# Patient Record
Sex: Female | Born: 1990 | Race: White | Hispanic: No | Marital: Single | Smoking: Former smoker
Health system: Southern US, Community
[De-identification: ages and names within clinical notes are randomized; demographics above are authoritative.]

## PROBLEM LIST (undated history)

## (undated) DIAGNOSIS — A498 Other bacterial infections of unspecified site: Secondary | ICD-10-CM

## (undated) DIAGNOSIS — Z9889 Other specified postprocedural states: Secondary | ICD-10-CM

## (undated) DIAGNOSIS — F419 Anxiety disorder, unspecified: Secondary | ICD-10-CM

## (undated) DIAGNOSIS — K219 Gastro-esophageal reflux disease without esophagitis: Secondary | ICD-10-CM

## (undated) DIAGNOSIS — L0100 Impetigo, unspecified: Secondary | ICD-10-CM

## (undated) DIAGNOSIS — F32A Depression, unspecified: Secondary | ICD-10-CM

## (undated) DIAGNOSIS — E282 Polycystic ovarian syndrome: Secondary | ICD-10-CM

## (undated) DIAGNOSIS — F1911 Other psychoactive substance abuse, in remission: Secondary | ICD-10-CM

## (undated) DIAGNOSIS — T8859XA Other complications of anesthesia, initial encounter: Secondary | ICD-10-CM

## (undated) DIAGNOSIS — T7840XA Allergy, unspecified, initial encounter: Secondary | ICD-10-CM

## (undated) DIAGNOSIS — F329 Major depressive disorder, single episode, unspecified: Secondary | ICD-10-CM

## (undated) DIAGNOSIS — K5909 Other constipation: Secondary | ICD-10-CM

## (undated) HISTORY — PX: PILONIDAL CYST DRAINAGE: SHX743

## (undated) HISTORY — DX: Allergy, unspecified, initial encounter: T78.40XA

## (undated) HISTORY — DX: Anxiety disorder, unspecified: F41.9

## (undated) HISTORY — PX: MICRODISCECTOMY LUMBAR: SUR864

---
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
## 2011-06-17 ENCOUNTER — Encounter: Payer: Self-pay | Admitting: *Deleted

## 2011-06-17 ENCOUNTER — Emergency Department (HOSPITAL_BASED_OUTPATIENT_CLINIC_OR_DEPARTMENT_OTHER)
Admission: EM | Admit: 2011-06-17 | Discharge: 2011-06-17 | Disposition: A | Discharge status: Home / Self Care | Payer: BC Managed Care – PPO | Attending: Emergency Medicine | Admitting: Emergency Medicine

## 2011-06-17 DIAGNOSIS — L0100 Impetigo, unspecified: Secondary | ICD-10-CM | POA: Insufficient documentation

## 2011-06-17 HISTORY — DX: Impetigo, unspecified: L01.00

## 2011-06-17 HISTORY — DX: Polycystic ovarian syndrome: E28.2

## 2011-06-17 MED ORDER — CLINDAMYCIN HCL 300 MG PO CAPS: 300.0000 mg | ORAL_CAPSULE | Freq: Four times a day (QID) | ORAL | Status: AC

## 2011-06-17 NOTE — ED Notes (Signed)
Pt states she was diagnosed with impetigo 2 weeks ago. Has been on PO antibiotic for 10 days. No better. Called her Dr. And was told to come to ED for ? IV antibiotics. Occurring only on face.

## 2011-06-17 NOTE — ED Notes (Signed)
D/c home with new RX for clindamycin

## 2011-06-17 NOTE — ED Provider Notes (Signed)
Scribed for Dr. Fonnie Jarvis, the patient was seen in room 06. This chart was scribed by Hillery Hunter. This patient's care was started at 15:51.   History   CSN: [id number] Arrival date & time: 06/17/2011  3:41 PM  Chief Complaint  Patient presents with  . Impetigo   Patient is a 20 y.o. female presenting with impetigo. The history is provided by the patient.  Impetigo Pertinent negatives include no chest pain, no abdominal pain, no headaches and no shortness of breath.    Cassie Blake is a 20 y.o. female who presents to the Emergency Department complaining of improving facial rash for the last few weeks. The rash at its worst was red, swollen with pustules and drainage, but has improved and is now without pustules and pain reduced to 2/10 now. She has been seen for these symptoms and prescribed Bactrim (10 days) that she has taken as prescribed. She had a fever (max 102 measured at home) that resolved one week ago.  She denies hx of DM, HTN, and takes metformin for PCOS without chronic abdominal pain menometrorrhagia. She is also taking Vivance for ADHD, and just finished two weeks of an unknown antibiotic. She has chronic migraines.  She smokes, drinks, but denies IVDA, used to use multiple street drugs, but recently got out of rehab and only smokes marijuana now.   Past Medical History  Diagnosis Date  . Impetigo   . Polycystic ovary syndrome     History reviewed. No pertinent past surgical history.  History reviewed. No pertinent family history.  History  Substance Use Topics  . Smoking status: Current Everyday Smoker  . Smokeless tobacco: Not on file  . Alcohol Use: Yes    Review of Systems  Constitutional: Negative for fever.  HENT: Negative for congestion and sore throat.   Eyes: Positive for discharge. Negative for visual disturbance.  Respiratory: Negative for shortness of breath.   Cardiovascular: Negative for chest pain.  Gastrointestinal: Negative for  nausea, vomiting, abdominal pain and diarrhea.  Genitourinary: Negative for dysuria.  Musculoskeletal: Negative for back pain.  Skin: Positive for rash.  Neurological: Negative for headaches.  Psychiatric/Behavioral: Negative for confusion and dysphoric mood.  All other systems reviewed and are negative.    Physical Exam  BP 119/70  Pulse 98  Temp(Src) 99.3 F (37.4 C) (Oral)  Resp 20  Ht 5\' 6"  (1.676 m)  Wt 250 lb (113.399 kg)  BMI 40.35 kg/m2  SpO2 98%  LMP 06/16/2008  Physical Exam  Nursing note and vitals reviewed. Constitutional:       Awake, alert, nontoxic appearance.  HENT:  Head: Atraumatic.  Eyes: Conjunctivae are normal. Pupils are equal, round, and reactive to light. Right eye exhibits no discharge. Left eye exhibits no discharge.  Neck: Neck supple.  Cardiovascular: Normal rate and normal heart sounds.   Pulmonary/Chest: Effort normal and breath sounds normal. She exhibits no tenderness.  Abdominal: Soft. There is no tenderness. There is no rebound.  Musculoskeletal: She exhibits no tenderness.       Baseline ROM, no obvious new focal weakness.  Neurological:       Mental status and motor strength appears baseline for patient and situation.  Skin: Rash (full face with multiple areas of confluent, erythematous papules, with some scaling and dry honey-colored crust, the only wheeping now is clear serous fluid, no fluctuance, warmth, or tenderness noted) noted.  Psychiatric: She has a normal mood and affect. Her behavior is normal.    ED Course  Procedures  OTHER DATA REVIEWED: Nursing notes, vital signs, and past medical records reviewed.   DIAGNOSTIC STUDIES: Oxygen Saturation is 98% on room air, normal by my interpretation.    MDM:06/17/2011 I doubt any other EMC precluding discharge at this time including, but not necessarily limited to the following:sepsis, abscess, TEN, SJS, SSSS.   IMPRESSION: Diagnoses that have been ruled out:  Diagnoses that  are still under consideration:  Final diagnoses:  Impetigo    PLAN:  Discharge home with clindamycin I have reviewed the discharge instructions with the patient  CONDITION ON DISCHARGE: Stable  DISCHARGE MEDICATIONS: New Prescriptions   CLINDAMYCIN (CLEOCIN) 300 MG CAPSULE    Take 1 capsule (300 mg total) by mouth 4 (four) times daily. X 7 days    SCRIBE ATTESTATION: I personally performed the services described in this documentation, which was scribed in my presence. The recorded information has been reviewed and considered. No att. providers found    Hurman Horn, MD 06/18/11 1316

## 2011-07-09 LAB — PROLACTIN: Prolactin: 8.2

## 2011-07-09 LAB — DRUGS OF ABUSE SCREEN W/O ALC, ROUTINE URINE
Barbiturate Quant, Ur: NEGATIVE
Benzodiazepines.: NEGATIVE
Creatinine,U: 354.5
Marijuana Metabolite: NEGATIVE
Methadone: NEGATIVE

## 2011-07-09 LAB — PREGNANCY, URINE: Preg Test, Ur: NEGATIVE

## 2011-07-09 LAB — RPR: RPR Ser Ql: NONREACTIVE

## 2011-07-09 LAB — TSH: TSH: 1.247

## 2011-07-09 LAB — CARBAMAZEPINE LEVEL, TOTAL: Carbamazepine Lvl: 6.6

## 2011-10-18 ENCOUNTER — Emergency Department (HOSPITAL_BASED_OUTPATIENT_CLINIC_OR_DEPARTMENT_OTHER)
Admission: EM | Admit: 2011-10-18 | Discharge: 2011-10-18 | Disposition: A | Discharge status: Home / Self Care | Payer: BC Managed Care – PPO | Attending: Emergency Medicine | Admitting: Emergency Medicine

## 2011-10-18 ENCOUNTER — Encounter (HOSPITAL_BASED_OUTPATIENT_CLINIC_OR_DEPARTMENT_OTHER): Payer: Self-pay | Admitting: *Deleted

## 2011-10-18 ENCOUNTER — Emergency Department (INDEPENDENT_AMBULATORY_CARE_PROVIDER_SITE_OTHER): Payer: BC Managed Care – PPO

## 2011-10-18 DIAGNOSIS — J3489 Other specified disorders of nose and nasal sinuses: Secondary | ICD-10-CM | POA: Insufficient documentation

## 2011-10-18 DIAGNOSIS — R05 Cough: Secondary | ICD-10-CM

## 2011-10-18 DIAGNOSIS — R0989 Other specified symptoms and signs involving the circulatory and respiratory systems: Secondary | ICD-10-CM

## 2011-10-18 DIAGNOSIS — Z79899 Other long term (current) drug therapy: Secondary | ICD-10-CM | POA: Insufficient documentation

## 2011-10-18 DIAGNOSIS — J45909 Unspecified asthma, uncomplicated: Secondary | ICD-10-CM

## 2011-10-18 DIAGNOSIS — R509 Fever, unspecified: Secondary | ICD-10-CM

## 2011-10-18 MED ORDER — ALBUTEROL SULFATE (5 MG/ML) 0.5% IN NEBU: 2.5000 mg | INHALATION_SOLUTION | RESPIRATORY_TRACT | Status: DC

## 2011-10-18 MED ORDER — ACETAMINOPHEN-CODEINE #3 300-30 MG PO TABS: 1.0000 | ORAL_TABLET | Freq: Four times a day (QID) | ORAL | Status: AC | PRN

## 2011-10-18 MED ORDER — ALBUTEROL SULFATE HFA 108 (90 BASE) MCG/ACT IN AERS: 1.0000 | INHALATION_SPRAY | Freq: Four times a day (QID) | RESPIRATORY_TRACT | Status: DC | PRN

## 2011-10-18 MED ORDER — IPRATROPIUM BROMIDE 0.02 % IN SOLN: 0.5000 mg | RESPIRATORY_TRACT | Status: DC

## 2011-10-18 MED ORDER — ALBUTEROL SULFATE (5 MG/ML) 0.5% IN NEBU
2.5000 mg | INHALATION_SOLUTION | Freq: Once | RESPIRATORY_TRACT | Status: AC
  Administered 2011-10-18: 2.5 mg via RESPIRATORY_TRACT
  Filled 2011-10-18: qty 0.5

## 2011-10-18 MED ORDER — AZITHROMYCIN 250 MG PO TABS: 250.0000 mg | ORAL_TABLET | Freq: Every day | ORAL | Status: AC

## 2011-10-18 MED ORDER — IPRATROPIUM BROMIDE 0.02 % IN SOLN
0.5000 mg | Freq: Once | RESPIRATORY_TRACT | Status: AC
  Administered 2011-10-18: 0.5 mg via RESPIRATORY_TRACT
  Filled 2011-10-18: qty 2.5

## 2011-10-18 NOTE — ED Provider Notes (Signed)
History     CSN: [id number]  Arrival date & time 10/18/11  1711   First MD Initiated Contact with Patient 10/18/11 1754      Chief Complaint  Patient presents with  . Cough  . Nasal Congestion    (Consider location/radiation/quality/duration/timing/severity/associated sxs/prior treatment) HPI  20yoF h/o asthma presents with shortness of breath, cough, congestion. Patient complains of 2 weeks of the same. Her cough is getting worse. She complains of diffuse chest soreness with coughing only. She states she felt short of breath today and took her albuterol inhaler with minimal relief. She complains of subjective fevers, chills. She denies abdominal pain, nausea, vomiting, back pain. Denies h/o VTE in self or family. No recent hosp/surg/immob. No h/o cancer. Denies exogenous hormone use, no leg pain or swelling  ED Notes, ED Provider Notes from 10/18/11 0000 to 10/18/11 17:24:01       Amy Theotis Barrio, RN 10/18/2011 17:22      PT AMB TO TRIAGE WITH QUICK STEADY GAIT IN NAD. PT REPORTS 2 WEEKS OF COUGH AND CONGESTION, SEEN AT [facility] "THEY GAVE ME A SHOT OF SOME KIND...".     Past Medical History  Diagnosis Date  . Impetigo   . Polycystic ovary syndrome   . Asthma     History reviewed. No pertinent past surgical history.  History reviewed. No pertinent family history.  History  Substance Use Topics  . Smoking status: Current Everyday Smoker  . Smokeless tobacco: Not on file  . Alcohol Use: Yes    OB History    Grav Para Term Preterm Abortions TAB SAB Ect Mult Living                  Review of Systems  All other systems reviewed and are negative.   except as noted HPI   Allergies  Review of patient's allergies indicates no known allergies.  Home Medications   Current Outpatient Rx  Name Route Sig Dispense Refill  . ALBUTEROL 90 MCG/ACT IN AERS Inhalation Inhale 2 puffs into the lungs every 6 (six) hours as needed. Shortness of breath and wheezing     .  VITAMIN C PO Oral Take 1 tablet by mouth daily.      Marland Kitchen LISDEXAMFETAMINE DIMESYLATE 70 MG PO CAPS Oral Take 70 mg by mouth every morning.      Marland Kitchen METFORMIN HCL ER (MOD) 500 MG PO TB24 Oral Take 500 mg by mouth 2 (two) times daily.     Marland Kitchen PHENYLEPHRINE-DM-GG-APAP 5-10-200-325 MG PO TABS Oral Take 2 tablets by mouth every 4 (four) hours as needed. For cough and cold symptoms     . VITAMIN D (ERGOCALCIFEROL) 50000 UNITS PO CAPS Oral Take 50,000 Units by mouth every 7 (seven) days. On Mondays     . ACETAMINOPHEN-CODEINE #3 300-30 MG PO TABS Oral Take 1-2 tablets by mouth every 6 (six) hours as needed for pain. 15 tablet 0  . ALBUTEROL SULFATE HFA 108 (90 BASE) MCG/ACT IN AERS Inhalation Inhale 1-2 puffs into the lungs every 6 (six) hours as needed for wheezing. 1 Inhaler 0  . AZITHROMYCIN 250 MG PO TABS Oral Take 1 tablet (250 mg total) by mouth daily. Take first 2 tablets together, then 1 every day until finished. 6 tablet 0    BP 129/63  Pulse 113  Temp(Src) 98.5 F (36.9 C) (Oral)  Resp 20  Ht 5\' 6"  (1.676 m)  Wt 260 lb (117.935 kg)  BMI 41.97 kg/m2  SpO2 96%  Physical Exam  Nursing note and vitals reviewed. Constitutional: She is oriented to person, place, and time. She appears well-developed.  HENT:  Head: Atraumatic.       +nasal congestion Mild posterior OP erythema ,no tonsillar swelling or exudates  Eyes: Conjunctivae and EOM are normal. Pupils are equal, round, and reactive to light.  Neck: Normal range of motion. Neck supple.  Cardiovascular: Normal rate, regular rhythm, normal heart sounds and intact distal pulses.   Pulmonary/Chest: Effort normal. No respiratory distress. She has wheezes. She has no rales.       Exp wheeze lt base  Abdominal: Soft. She exhibits no distension. There is no tenderness. There is no rebound and no guarding.  Musculoskeletal: Normal range of motion.  Neurological: She is alert and oriented to person, place, and time.  Skin: Skin is warm and dry. No  rash noted.  Psychiatric: She has a normal mood and affect.    ED Course  Procedures (including critical care time)  Labs Reviewed - No data to display Dg Chest 2 View  10/18/2011  *RADIOLOGY REPORT*  Clinical Data: Cough, congestion, fever.  CHEST - 2 VIEW  Comparison: 03/05/2008  Findings: Mild peribronchial thickening. Heart and mediastinal contours are within normal limits.  No focal opacities or effusions.  No acute bony abnormality.  IMPRESSION: Bronchitic changes.  Original Report Authenticated By: Cyndie Chime, M.D.     1. Asthmatic bronchitis      MDM  She as well presents with nasal congestion, cough, wheezing. Chest x-ray negative for pneumonia. Will treat for bronchitis. She's feeling slightly better with albuterol here. Her oxygen saturations are 96-100% on RA with the exception of her 93%. I do not suspect acute PE-- rather tachycardia from albuterol use. Patient comfortable with discharge home and PCP f/u. Given strict precautions for return.  BP 129/63  Pulse 113  Temp(Src) 98.5 F (36.9 C) (Oral)  Resp 20  Ht 5\' 6"  (1.676 m)  Wt 260 lb (117.935 kg)  BMI 41.97 kg/m2  SpO2 96%       Forbes Cellar, MD 10/19/11 0002

## 2011-10-18 NOTE — ED Notes (Signed)
PT AMB TO TRIAGE WITH QUICK STEADY GAIT IN NAD. PT REPORTS 2 WEEKS OF COUGH AND CONGESTION, SEEN AT [facility] "THEY GAVE ME A SHOT OF SOME KIND...".

## 2012-02-01 ENCOUNTER — Encounter (HOSPITAL_BASED_OUTPATIENT_CLINIC_OR_DEPARTMENT_OTHER): Payer: Self-pay | Admitting: *Deleted

## 2012-02-01 ENCOUNTER — Emergency Department (INDEPENDENT_AMBULATORY_CARE_PROVIDER_SITE_OTHER): Payer: BC Managed Care – PPO

## 2012-02-01 ENCOUNTER — Emergency Department (HOSPITAL_BASED_OUTPATIENT_CLINIC_OR_DEPARTMENT_OTHER)
Admission: EM | Admit: 2012-02-01 | Discharge: 2012-02-01 | Disposition: A | Discharge status: Home / Self Care | Payer: BC Managed Care – PPO | Attending: Emergency Medicine | Admitting: Emergency Medicine

## 2012-02-01 DIAGNOSIS — N1330 Unspecified hydronephrosis: Secondary | ICD-10-CM | POA: Insufficient documentation

## 2012-02-01 DIAGNOSIS — J45909 Unspecified asthma, uncomplicated: Secondary | ICD-10-CM | POA: Insufficient documentation

## 2012-02-01 DIAGNOSIS — N320 Bladder-neck obstruction: Secondary | ICD-10-CM

## 2012-02-01 DIAGNOSIS — Z79899 Other long term (current) drug therapy: Secondary | ICD-10-CM | POA: Insufficient documentation

## 2012-02-01 DIAGNOSIS — R109 Unspecified abdominal pain: Secondary | ICD-10-CM

## 2012-02-01 DIAGNOSIS — N23 Unspecified renal colic: Secondary | ICD-10-CM

## 2012-02-01 DIAGNOSIS — N2889 Other specified disorders of kidney and ureter: Secondary | ICD-10-CM

## 2012-02-01 HISTORY — DX: Other psychoactive substance abuse, in remission: F19.11

## 2012-02-01 LAB — URINALYSIS, ROUTINE W REFLEX MICROSCOPIC
Bilirubin Urine: NEGATIVE
Ketones, ur: NEGATIVE mg/dL
Leukocytes, UA: NEGATIVE
Nitrite: NEGATIVE
Protein, ur: NEGATIVE mg/dL
Urobilinogen, UA: 0.2 mg/dL (ref 0.0–1.0)

## 2012-02-01 LAB — URINE MICROSCOPIC-ADD ON

## 2012-02-01 MED ORDER — SODIUM CHLORIDE 0.9 % IV SOLN
INTRAVENOUS | Status: DC
  Administered 2012-02-01: 1000 mL via INTRAVENOUS

## 2012-02-01 MED ORDER — ONDANSETRON 8 MG PO TBDP: 8.0000 mg | ORAL_TABLET | Freq: Three times a day (TID) | ORAL | Status: AC | PRN

## 2012-02-01 MED ORDER — NAPROXEN SODIUM 220 MG PO TABS: ORAL_TABLET | ORAL | Status: DC

## 2012-02-01 MED ORDER — OXYCODONE-ACETAMINOPHEN 10-325 MG PO TABS: 1.0000 | ORAL_TABLET | ORAL | Status: AC | PRN

## 2012-02-01 MED ORDER — KETOROLAC TROMETHAMINE 30 MG/ML IJ SOLN
30.0000 mg | Freq: Once | INTRAMUSCULAR | Status: AC
  Administered 2012-02-01: 30 mg via INTRAVENOUS
  Filled 2012-02-01: qty 1

## 2012-02-01 MED ORDER — ONDANSETRON HCL 4 MG/2ML IJ SOLN
4.0000 mg | Freq: Once | INTRAMUSCULAR | Status: AC
  Administered 2012-02-01: 4 mg via INTRAVENOUS
  Filled 2012-02-01: qty 2

## 2012-02-01 MED ORDER — FENTANYL CITRATE 0.05 MG/ML IJ SOLN
50.0000 ug | Freq: Once | INTRAMUSCULAR | Status: AC
  Administered 2012-02-01: 50 ug via INTRAVENOUS
  Filled 2012-02-01: qty 2

## 2012-02-01 NOTE — Discharge Instructions (Signed)
Ureteral Colic (Kidney Stones) Ureteral colic is the result of a condition when kidney stones form inside the kidney. Once kidney stones are formed they may move into the tube that connects the kidney with the bladder (ureter). If this occurs, this condition may cause pain (colic) in the ureter.  CAUSES  Pain is caused by stone movement in the ureter and the obstruction caused by the stone. SYMPTOMS  The pain comes and goes as the ureter contracts around the stone. The pain is usually intense, sharp, and stabbing in character. The location of the pain may move as the stone moves through the ureter. When the stone is near the kidney the pain is usually located in the back and radiates to the belly (abdomen). When the stone is ready to pass into the bladder the pain is often located in the lower abdomen on the side the stone is located. At this location, the symptoms may mimic those of a urinary tract infection with urinary frequency. Once the stone is located here it often passes into the bladder and the pain disappears completely. TREATMENT   Your caregiver will provide you with medicine for pain relief.   You may require specialized follow-up X-rays.   The absence of pain does not always mean that the stone has passed. It may have just stopped moving. If the urine remains completely obstructed, it can cause loss of kidney function or even complete destruction of the involved kidney. It is your responsibility and in your interest that X-rays and follow-ups as suggested by your caregiver are completed. Relief of pain without passage of the stone can be associated with severe damage to the kidney, including loss of kidney function on that side.   If your stone does not pass on its own, additional measures may be taken by your caregiver to ensure its removal.  HOME CARE INSTRUCTIONS   Increase your fluid intake. Water is the preferred fluid since juices containing vitamin C may acidify the urine making  it less likely for certain stones (uric acid stones) to pass.   Strain all urine. A strainer will be provided. Keep all particulate matter or stones for your caregiver to inspect.   Take your pain medicine as directed.   Make a follow-up appointment with your caregiver as directed.   Remember that the goal is passage of your stone. The absence of pain does not mean the stone is gone. Follow your caregiver's instructions.   Only take over-the-counter or prescription medicines for pain, discomfort, or fever as directed by your caregiver.  SEEK MEDICAL CARE IF:   Pain cannot be controlled with the prescribed medicine.   You have a fever.   Pain continues for longer than your caregiver advises it should.   There is a change in the pain, and you develop chest discomfort or constant abdominal pain.   You feel faint or pass out.  MAKE SURE YOU:   Understand these instructions.   Will watch your condition.   Will get help right away if you are not doing well or get worse.  Document Released: 07/11/2005 Document Revised: 09/20/2011 Document Reviewed: 03/28/2011 [employer] Patient Information 2012 [employer]. 

## 2012-02-01 NOTE — ED Provider Notes (Signed)
History     CSN: [id number]  Arrival date & time 02/01/12  0425   First MD Initiated Contact with Patient 02/01/12 0445      Chief Complaint  Patient presents with  . Flank Pain    (Consider location/radiation/quality/duration/timing/severity/associated sxs/prior treatment) HPI This is a 21 year old white female with a 2 hour history of severe left flank pain that woke her from sleep. She's never had a similar pain in the past. It is been accompanied by nausea and vomiting. She did have some dysuria yesterday morning but that resolved and has not persisted. She denies fever or chills. She denies change in pain with movement or palpation. She has a history of urinary tract infections but not pyelonephritis. She does have a history of narcotic abuse in the past.  Past Medical History  Diagnosis Date  . Impetigo   . Polycystic ovary syndrome   . Asthma   . H/O drug abuse     History reviewed. No pertinent past surgical history.  History reviewed. No pertinent family history.  History  Substance Use Topics  . Smoking status: Current Everyday Smoker  . Smokeless tobacco: Not on file  . Alcohol Use: Yes    OB History    Grav Para Term Preterm Abortions TAB SAB Ect Mult Living                  Review of Systems  All other systems reviewed and are negative.    Allergies  Review of patient's allergies indicates no known allergies.  Home Medications   Current Outpatient Rx  Name Route Sig Dispense Refill  . ALBUTEROL SULFATE HFA 108 (90 BASE) MCG/ACT IN AERS Inhalation Inhale 1-2 puffs into the lungs every 6 (six) hours as needed for wheezing. 1 Inhaler 0  . ALBUTEROL 90 MCG/ACT IN AERS Inhalation Inhale 2 puffs into the lungs every 6 (six) hours as needed. Shortness of breath and wheezing     . VITAMIN C PO Oral Take 1 tablet by mouth daily.      Marland Kitchen LISDEXAMFETAMINE DIMESYLATE 70 MG PO CAPS Oral Take 70 mg by mouth every morning.      Marland Kitchen METFORMIN HCL ER (MOD) 500 MG  PO TB24 Oral Take 500 mg by mouth 2 (two) times daily.     Marland Kitchen PHENYLEPHRINE-DM-GG-APAP 5-10-200-325 MG PO TABS Oral Take 2 tablets by mouth every 4 (four) hours as needed. For cough and cold symptoms    . VITAMIN D (ERGOCALCIFEROL) 50000 UNITS PO CAPS Oral Take 50,000 Units by mouth every 7 (seven) days. On Mondays       BP 123/72  Pulse 97  Temp(Src) 97.6 F (36.4 C) (Oral)  Resp 20  SpO2 98%  LMP 12/04/2011  Physical Exam General: Well-developed, well-nourished female in no acute distress; appearance consistent with age of record HENT: normocephalic, atraumatic Eyes: pupils equal round and reactive to light; extraocular muscles intact Neck: supple Heart: regular rate and rhythm Lungs: clear to auscultation bilaterally Abdomen: soft; nondistended; nontender; bowel sounds present GU: no flank tenderness Extremities: No deformity; full range of motion Neurologic: Awake, alert and oriented; motor function intact in all extremities and symmetric; no facial droop Skin: Warm and dry Psychiatric: Anxious    ED Course  Procedures (including critical care time)    MDM   Nursing notes and vitals signs, including pulse oximetry, reviewed.  Summary of this visit's results, reviewed by myself:  Labs:  Results for orders placed during the hospital encounter of 02/01/12  URINALYSIS, ROUTINE W REFLEX MICROSCOPIC      Component Value Range   Color, Urine YELLOW  YELLOW    APPearance CLEAR  CLEAR    Specific Gravity, Urine 1.020  1.005 - 1.030    pH 5.5  5.0 - 8.0    Glucose, UA NEGATIVE  NEGATIVE (mg/dL)   Hgb urine dipstick LARGE (*) NEGATIVE    Bilirubin Urine NEGATIVE  NEGATIVE    Ketones, ur NEGATIVE  NEGATIVE (mg/dL)   Protein, ur NEGATIVE  NEGATIVE (mg/dL)   Urobilinogen, UA 0.2  0.0 - 1.0 (mg/dL)   Nitrite NEGATIVE  NEGATIVE    Leukocytes, UA NEGATIVE  NEGATIVE   PREGNANCY, URINE      Component Value Range   Preg Test, Ur NEGATIVE  NEGATIVE   URINE MICROSCOPIC-ADD ON        Component Value Range   Squamous Epithelial / LPF FEW (*) RARE    WBC, UA 0-2  <3 (WBC/hpf)   RBC / HPF 21-50  <3 (RBC/hpf)   Bacteria, UA FEW (*) RARE     Imaging Studies: Ct Abdomen Pelvis Wo Contrast  02/01/2012  *RADIOLOGY REPORT*  Clinical Data: Left flank pain.  CT ABDOMEN AND PELVIS WITHOUT CONTRAST  Technique:  Multidetector CT imaging of the abdomen and pelvis was performed following the standard protocol without intravenous contrast.  Comparison: None.  Findings: Minimal right basilar atelectasis is noted.  The liver and spleen are unremarkable in appearance.  The gallbladder is within normal limits.  The pancreas and adrenal glands are unremarkable.  There is prominence of the left renal pelvis and the left ureter, with mild left-sided pelvicaliectasis but no significant hydronephrosis.  An apparent tiny obstructing 3 mm stone is noted distally, at the left vesicoureteral junction.  The right kidney is grossly unremarkable appearance.  No nonobstructing renal stones are identified.  No free fluid is identified.  The small bowel is unremarkable in appearance.  The stomach is within normal limits.  No acute vascular abnormalities are seen.  The appendix is normal in caliber and contains air, without evidence for appendicitis.  The colon is grossly unremarkable in appearance.  The bladder is largely decompressed and otherwise grossly unremarkable in appearance.  The uterus is within normal limits. The ovaries are relatively symmetric; no suspicious adnexal masses are seen.  No inguinal lymphadenopathy is seen.  No acute osseous abnormalities are identified.  IMPRESSION: Prominence of the left renal pelvis and left ureter, with mild left- sided pelvicaliectasis and an apparent tiny obstructing 3 mm stone noted distally, at the left vesicoureteral junction.  No significant hydronephrosis seen at this time.  Original Report Authenticated By: Tonia Ghent, M.D.            Hanley Seamen, MD 02/01/12 [phone]

## 2012-02-01 NOTE — ED Notes (Signed)
Pt states that began having UTI sx yesterday am woke this am with sharp severe pain in left flank as well as N/V

## 2012-02-04 ENCOUNTER — Emergency Department (HOSPITAL_BASED_OUTPATIENT_CLINIC_OR_DEPARTMENT_OTHER)
Admission: EM | Admit: 2012-02-04 | Discharge: 2012-02-04 | Disposition: A | Discharge status: Home / Self Care | Payer: BC Managed Care – PPO | Attending: Emergency Medicine | Admitting: Emergency Medicine

## 2012-02-04 ENCOUNTER — Encounter (HOSPITAL_BASED_OUTPATIENT_CLINIC_OR_DEPARTMENT_OTHER): Payer: Self-pay

## 2012-02-04 DIAGNOSIS — R109 Unspecified abdominal pain: Secondary | ICD-10-CM | POA: Insufficient documentation

## 2012-02-04 DIAGNOSIS — F17200 Nicotine dependence, unspecified, uncomplicated: Secondary | ICD-10-CM | POA: Insufficient documentation

## 2012-02-04 DIAGNOSIS — N390 Urinary tract infection, site not specified: Secondary | ICD-10-CM

## 2012-02-04 DIAGNOSIS — J45909 Unspecified asthma, uncomplicated: Secondary | ICD-10-CM | POA: Insufficient documentation

## 2012-02-04 DIAGNOSIS — R509 Fever, unspecified: Secondary | ICD-10-CM | POA: Insufficient documentation

## 2012-02-04 LAB — BASIC METABOLIC PANEL
BUN: 11 mg/dL (ref 6–23)
Chloride: 104 mEq/L (ref 96–112)
Creatinine, Ser: 0.6 mg/dL (ref 0.50–1.10)
GFR calc Af Amer: >90 mL/min (ref >90)
Glucose, Bld: 128 mg/dL — ABNORMAL HIGH (ref 70–99)
Potassium: 3.8 mEq/L (ref 3.5–5.1)

## 2012-02-04 LAB — DIFFERENTIAL
Basophils Relative: 0 % (ref 0–1)
Eosinophils Absolute: 0.2 10*3/uL (ref 0.0–0.7)
Lymphs Abs: 2.7 10*3/uL (ref 0.7–4.0)
Monocytes Absolute: 0.4 10*3/uL (ref 0.1–1.0)
Monocytes Relative: 7 % (ref 3–12)
Neutro Abs: 2.6 10*3/uL (ref 1.7–7.7)
Neutrophils Relative %: 44 % (ref 43–77)

## 2012-02-04 LAB — CBC
HCT: 39.2 % (ref 36.0–46.0)
Hemoglobin: 14 g/dL (ref 12.0–15.0)
MCH: 30.4 pg (ref 26.0–34.0)
MCHC: 35.7 g/dL (ref 30.0–36.0)
RBC: 4.6 MIL/uL (ref 3.87–5.11)

## 2012-02-04 LAB — URINE MICROSCOPIC-ADD ON

## 2012-02-04 LAB — URINALYSIS, ROUTINE W REFLEX MICROSCOPIC
Bilirubin Urine: NEGATIVE
Glucose, UA: NEGATIVE mg/dL
Hgb urine dipstick: NEGATIVE
Ketones, ur: NEGATIVE mg/dL
Protein, ur: NEGATIVE mg/dL
pH: 6 (ref 5.0–8.0)

## 2012-02-04 MED ORDER — SODIUM CHLORIDE 0.9 % IV BOLUS (SEPSIS)
500.0000 mL | Freq: Once | INTRAVENOUS | Status: AC
  Administered 2012-02-04: 500 mL via INTRAVENOUS

## 2012-02-04 MED ORDER — CEPHALEXIN 500 MG PO CAPS: 500.0000 mg | ORAL_CAPSULE | Freq: Four times a day (QID) | ORAL | Status: AC

## 2012-02-04 MED ORDER — DEXTROSE 5 % IV SOLN
1.0000 g | Freq: Once | INTRAVENOUS | Status: AC
  Administered 2012-02-04: 1 g via INTRAVENOUS
  Filled 2012-02-04: qty 10

## 2012-02-04 NOTE — ED Notes (Signed)
Pt states that she was seen Friday here at [facility] and dx with Kidney Stone, pt states that she has severe abdominal pain because she has not passed the stone at this time, but has been running a fever.  Pt is concerned that she has developed a UTI.

## 2012-02-04 NOTE — Discharge Instructions (Signed)

## 2012-02-04 NOTE — ED Provider Notes (Signed)
History     CSN: [id number]  Arrival date & time 02/04/12  1349   First MD Initiated Contact with Patient 02/04/12 1419      Chief Complaint  Patient presents with  . Fever  . Nephrolithiasis    Patient is a 21 y.o. female presenting with flank pain. The history is provided by the patient.  Flank Pain This is a recurrent problem. The current episode started more than 2 days ago. The problem occurs daily. The problem has been gradually worsening. Associated symptoms include abdominal pain. Pertinent negatives include no headaches and no shortness of breath. The symptoms are aggravated by nothing. The symptoms are relieved by nothing. Treatments tried: analgesics. The treatment provided mild relief.  pt presents with continued left flank pain and now reports fever at home She reports Tmax at 100.1 while taking pain meds She also reports some nausea, vomting and mild diarrhea No cough reported She reports dysuria  Past Medical History  Diagnosis Date  . Impetigo   . Polycystic ovary syndrome   . Asthma   . H/O drug abuse     History reviewed. No pertinent past surgical history.  History reviewed. No pertinent family history.  History  Substance Use Topics  . Smoking status: Current Everyday Smoker    Types: Cigarettes  . Smokeless tobacco: Not on file  . Alcohol Use: Yes     socially    OB History    Grav Para Term Preterm Abortions TAB SAB Ect Mult Living                  Review of Systems  Respiratory: Negative for shortness of breath.   Gastrointestinal: Positive for abdominal pain.  Genitourinary: Positive for flank pain.  Neurological: Negative for headaches.  All other systems reviewed and are negative.    Allergies  Review of patient's allergies indicates no known allergies.  Home Medications   Current Outpatient Rx  Name Route Sig Dispense Refill  . ALBUTEROL SULFATE HFA 108 (90 BASE) MCG/ACT IN AERS Inhalation Inhale 1-2 puffs into the lungs  every 6 (six) hours as needed for wheezing. 1 Inhaler 0  . ALBUTEROL 90 MCG/ACT IN AERS Inhalation Inhale 2 puffs into the lungs every 6 (six) hours as needed. Shortness of breath and wheezing     . VITAMIN C PO Oral Take 1 tablet by mouth daily.      Marland Kitchen LISDEXAMFETAMINE DIMESYLATE 70 MG PO CAPS Oral Take 70 mg by mouth every morning.      Marland Kitchen METFORMIN HCL ER (MOD) 500 MG PO TB24 Oral Take 500 mg by mouth 2 (two) times daily.     Marland Kitchen NAPROXEN SODIUM 220 MG PO TABS  Take 2 tablets every 12 hours until stone passes.    . ONDANSETRON 8 MG PO TBDP Oral Take 1 tablet (8 mg total) by mouth every 8 (eight) hours as needed for nausea. 10 tablet 0  . OXYCODONE-ACETAMINOPHEN 10-325 MG PO TABS Oral Take 1 tablet by mouth every 4 (four) hours as needed for pain. 30 tablet 0  . PHENYLEPHRINE-DM-GG-APAP 5-10-200-325 MG PO TABS Oral Take 2 tablets by mouth every 4 (four) hours as needed. For cough and cold symptoms    . VITAMIN D (ERGOCALCIFEROL) 50000 UNITS PO CAPS Oral Take 50,000 Units by mouth every 7 (seven) days. On Mondays       BP 156/100  Pulse 108  Temp(Src) 98.4 F (36.9 C) (Oral)  Resp 21  Ht 5\' 6"  (1.676  m)  Wt 245 lb (111.131 kg)  BMI 39.54 kg/m2  SpO2 100%  LMP 12/04/2011  Physical Exam CONSTITUTIONAL: Well developed/well nourished HEAD AND FACE: Normocephalic/atraumatic EYES: EOMI/PERRL ENMT: Mucous membranes moist NECK: supple no meningeal signs SPINE:entire spine nontender CV: S1/S2 noted, no murmurs/rubs/gallops noted LUNGS: Lungs are clear to auscultation bilaterally, no apparent distress ABDOMEN: soft, nontender, no rebound or guarding QI:ONGE cva tenderness NEURO: Pt is awake/alert, moves all extremitiesx4 EXTREMITIES: pulses normal, full ROM SKIN: warm, color normal PSYCH: no abnormalities of mood noted  ED Course  Procedures Labs Reviewed  URINALYSIS, ROUTINE W REFLEX MICROSCOPIC - Abnormal; Notable for the following:    Nitrite POSITIVE (*)    Leukocytes, UA TRACE  (*)    All other components within normal limits  URINE MICROSCOPIC-ADD ON - Abnormal; Notable for the following:    Squamous Epithelial / LPF FEW (*)    Bacteria, UA MANY (*)    All other components within normal limits  PREGNANCY, URINE  CBC  DIFFERENTIAL  BASIC METABOLIC PANEL   9:52 PM Pt with recent diagnosis of ureteral stone, now reports low grade fever at home with continued pain Pt does not want pain meds at this time 3:18 PM D/w dr Margarita Grizzle, urology We discussed recent low grade temp (100.1), small distal stone and evidence of uti and afebrile here Pt is well appearing/nontoxic He feels she is safe for d/c home.  Start antibiotics, send urine culture and f/u this week Discussed strict return precautions    MDM  Nursing notes reviewed and considered in documentation labs/vitals reviewed and considered Previous records reviewed and considered         Joya Gaskins, MD 02/04/12 1524

## 2012-02-06 LAB — URINE CULTURE
Colony Count: >=100000
Culture  Setup Time: 201304230153

## 2012-02-07 NOTE — ED Notes (Signed)
+  Urine. Patient treated with Keflex. Sensitive to same. Per protocol MD. °

## 2013-01-20 ENCOUNTER — Emergency Department (HOSPITAL_BASED_OUTPATIENT_CLINIC_OR_DEPARTMENT_OTHER): Payer: BC Managed Care – PPO

## 2013-01-20 ENCOUNTER — Encounter (HOSPITAL_BASED_OUTPATIENT_CLINIC_OR_DEPARTMENT_OTHER): Payer: Self-pay

## 2013-01-20 ENCOUNTER — Emergency Department (HOSPITAL_BASED_OUTPATIENT_CLINIC_OR_DEPARTMENT_OTHER)
Admission: EM | Admit: 2013-01-20 | Discharge: 2013-01-20 | Disposition: A | Discharge status: Home / Self Care | Payer: BC Managed Care – PPO | Attending: Emergency Medicine | Admitting: Emergency Medicine

## 2013-01-20 DIAGNOSIS — Z8719 Personal history of other diseases of the digestive system: Secondary | ICD-10-CM | POA: Insufficient documentation

## 2013-01-20 DIAGNOSIS — Z862 Personal history of diseases of the blood and blood-forming organs and certain disorders involving the immune mechanism: Secondary | ICD-10-CM | POA: Insufficient documentation

## 2013-01-20 DIAGNOSIS — Z8639 Personal history of other endocrine, nutritional and metabolic disease: Secondary | ICD-10-CM | POA: Insufficient documentation

## 2013-01-20 DIAGNOSIS — F1910 Other psychoactive substance abuse, uncomplicated: Secondary | ICD-10-CM | POA: Insufficient documentation

## 2013-01-20 DIAGNOSIS — F3289 Other specified depressive episodes: Secondary | ICD-10-CM | POA: Insufficient documentation

## 2013-01-20 DIAGNOSIS — J45909 Unspecified asthma, uncomplicated: Secondary | ICD-10-CM | POA: Insufficient documentation

## 2013-01-20 DIAGNOSIS — F329 Major depressive disorder, single episode, unspecified: Secondary | ICD-10-CM | POA: Insufficient documentation

## 2013-01-20 DIAGNOSIS — Z79899 Other long term (current) drug therapy: Secondary | ICD-10-CM | POA: Insufficient documentation

## 2013-01-20 DIAGNOSIS — Z872 Personal history of diseases of the skin and subcutaneous tissue: Secondary | ICD-10-CM | POA: Insufficient documentation

## 2013-01-20 DIAGNOSIS — R110 Nausea: Secondary | ICD-10-CM | POA: Insufficient documentation

## 2013-01-20 DIAGNOSIS — Z3202 Encounter for pregnancy test, result negative: Secondary | ICD-10-CM | POA: Insufficient documentation

## 2013-01-20 DIAGNOSIS — K5900 Constipation, unspecified: Secondary | ICD-10-CM | POA: Insufficient documentation

## 2013-01-20 DIAGNOSIS — F17200 Nicotine dependence, unspecified, uncomplicated: Secondary | ICD-10-CM | POA: Insufficient documentation

## 2013-01-20 HISTORY — DX: Depression, unspecified: F32.A

## 2013-01-20 HISTORY — DX: Major depressive disorder, single episode, unspecified: F32.9

## 2013-01-20 HISTORY — DX: Other constipation: K59.09

## 2013-01-20 HISTORY — DX: Gastro-esophageal reflux disease without esophagitis: K21.9

## 2013-01-20 LAB — URINALYSIS, ROUTINE W REFLEX MICROSCOPIC
Bilirubin Urine: NEGATIVE
Glucose, UA: NEGATIVE mg/dL
Hgb urine dipstick: NEGATIVE
Ketones, ur: NEGATIVE mg/dL
Protein, ur: NEGATIVE mg/dL
pH: 6.5 (ref 5.0–8.0)

## 2013-01-20 MED ORDER — POLYETHYLENE GLYCOL 3350 17 G PO PACK: 17.0000 g | PACK | Freq: Every day | ORAL | Status: DC

## 2013-01-20 MED ORDER — NAPROXEN 500 MG PO TABS: 500.0000 mg | ORAL_TABLET | Freq: Two times a day (BID) | ORAL | Status: DC

## 2013-01-20 MED ORDER — KETOROLAC TROMETHAMINE 60 MG/2ML IM SOLN
60.0000 mg | Freq: Once | INTRAMUSCULAR | Status: AC
  Administered 2013-01-20: 60 mg via INTRAMUSCULAR
  Filled 2013-01-20: qty 2

## 2013-01-20 NOTE — ED Provider Notes (Signed)
Medical screening examination/treatment/procedure(s) were performed by non-physician practitioner and as supervising physician I was immediately available for consultation/collaboration.   Rolan Bucco, MD 01/20/13 1410

## 2013-01-20 NOTE — ED Notes (Signed)
Lower abd pain "for months"-worse since this am-denies n/v/d/vaginal discharge,urinary s/s-states she has hx of constipation and takes stool softeners with small BM today-states she has been seen multiple times by PCP and an endochronologist-no GYN or GI f/u-states she is awaiting appt with fertility specialist r/t to PCOD

## 2013-01-20 NOTE — ED Provider Notes (Signed)
History     CSN: [id number]  Arrival date & time 01/20/13  1140   First MD Initiated Contact with Patient 01/20/13 1214      Chief Complaint  Patient presents with  . Abdominal Pain    (Consider location/radiation/quality/duration/timing/severity/associated sxs/prior treatment) Patient is a 22 y.o. female presenting with abdominal pain. The history is provided by the patient. No language interpreter was used.  Abdominal Pain Pain location:  Generalized Pain quality: aching and fullness   Pain radiates to:  Suprapubic region Pain severity:  Moderate Onset quality:  Gradual Duration: months. Timing:  Sporadic Progression:  Worsening Relieved by:  Nothing Worsened by:  Nothing tried Ineffective treatments:  None tried Associated symptoms: nausea   Pt has chronic pain from polycystic ovarian syndrome.  Pt unable to tolerte metformin  Past Medical History  Diagnosis Date  . Impetigo   . Polycystic ovary syndrome   . Asthma   . H/O drug abuse   . GERD (gastroesophageal reflux disease)   . Constipation, chronic   . Depression     Past Surgical History  Procedure Laterality Date  . Pilonidal cyst drainage      No family history on file.  History  Substance Use Topics  . Smoking status: Current Every Day Smoker    Types: Cigarettes  . Smokeless tobacco: Not on file  . Alcohol Use: Yes     Comment: socially    OB History   Grav Para Term Preterm Abortions TAB SAB Ect Mult Living                  Review of Systems  Gastrointestinal: Positive for nausea and abdominal pain.  All other systems reviewed and are negative.    Allergies  Review of patient's allergies indicates no known allergies.  Home Medications   Current Outpatient Rx  Name  Route  Sig  Dispense  Refill  . Docusate Sodium (COLACE PO)   Oral   Take by mouth.         . IBUPROFEN IB PO   Oral   Take by mouth.         . EXPIRED: albuterol (PROVENTIL HFA;VENTOLIN HFA) 108 (90 BASE)  MCG/ACT inhaler   Inhalation   Inhale 1-2 puffs into the lungs every 6 (six) hours as needed for wheezing.   1 Inhaler   0   . albuterol (PROVENTIL,VENTOLIN) 90 MCG/ACT inhaler   Inhalation   Inhale 2 puffs into the lungs every 6 (six) hours as needed. Shortness of breath and wheezing          . Ascorbic Acid (VITAMIN C PO)   Oral   Take 1 tablet by mouth daily.           Marland Kitchen lisdexamfetamine (VYVANSE) 70 MG capsule   Oral   Take 70 mg by mouth every morning.           . metFORMIN (GLUMETZA) 500 MG (MOD) 24 hr tablet   Oral   Take 500 mg by mouth 2 (two) times daily.          . naproxen sodium (ALEVE) 220 MG tablet      Take 2 tablets every 12 hours until stone passes.         Marland Kitchen Phenylephrine-DM-GG-APAP (TYLENOL COLD/FLU SEVERE) 5-10-200-325 MG TABS   Oral   Take 2 tablets by mouth every 4 (four) hours as needed. For cough and cold symptoms         .  Vitamin D, Ergocalciferol, (DRISDOL) 50000 UNITS CAPS   Oral   Take 50,000 Units by mouth every 7 (seven) days. On Mondays            BP 114/80  Pulse 69  Temp(Src) 98.3 F (36.8 C) (Oral)  Resp 16  Ht 5' 5.5" (1.664 m)  Wt 205 lb (92.987 kg)  BMI 33.58 kg/m2  SpO2 96%  Physical Exam  Nursing note and vitals reviewed. Constitutional: She appears well-developed and well-nourished.  HENT:  Head: Normocephalic and atraumatic.  Eyes: Pupils are equal, round, and reactive to light.  Neck: Normal range of motion.  Cardiovascular: Normal rate and normal heart sounds.   Pulmonary/Chest: Effort normal.  Abdominal: Soft. There is tenderness.  Musculoskeletal: Normal range of motion.  Neurological: She is alert.  Skin: Skin is warm.  Psychiatric: She has a normal mood and affect.    ED Course  Procedures (including critical care time)  Labs Reviewed  URINALYSIS, ROUTINE W REFLEX MICROSCOPIC - Abnormal; Notable for the following:    APPearance CLOUDY (*)    All other components within normal limits   PREGNANCY, URINE   No results found.   No diagnosis found.    MDM  Pt given rx for miralax.   Pt given referral to [facility] hospital clinic.        Lonia Skinner Alexis, PA-C 01/20/13 [phone]

## 2015-05-18 ENCOUNTER — Emergency Department (HOSPITAL_COMMUNITY)
Admission: EM | Admit: 2015-05-18 | Discharge: 2015-05-18 | Disposition: A | Discharge status: Home / Self Care | Payer: BLUE CROSS/BLUE SHIELD | Attending: Emergency Medicine | Admitting: Emergency Medicine

## 2015-05-18 ENCOUNTER — Emergency Department (HOSPITAL_COMMUNITY): Payer: BLUE CROSS/BLUE SHIELD

## 2015-05-18 ENCOUNTER — Encounter (HOSPITAL_COMMUNITY): Payer: Self-pay | Admitting: Emergency Medicine

## 2015-05-18 DIAGNOSIS — K219 Gastro-esophageal reflux disease without esophagitis: Secondary | ICD-10-CM | POA: Diagnosis not present

## 2015-05-18 DIAGNOSIS — F329 Major depressive disorder, single episode, unspecified: Secondary | ICD-10-CM | POA: Insufficient documentation

## 2015-05-18 DIAGNOSIS — Z3202 Encounter for pregnancy test, result negative: Secondary | ICD-10-CM | POA: Diagnosis not present

## 2015-05-18 DIAGNOSIS — K5900 Constipation, unspecified: Secondary | ICD-10-CM | POA: Diagnosis not present

## 2015-05-18 DIAGNOSIS — R1031 Right lower quadrant pain: Secondary | ICD-10-CM

## 2015-05-18 DIAGNOSIS — Z872 Personal history of diseases of the skin and subcutaneous tissue: Secondary | ICD-10-CM | POA: Insufficient documentation

## 2015-05-18 DIAGNOSIS — Z8742 Personal history of other diseases of the female genital tract: Secondary | ICD-10-CM | POA: Insufficient documentation

## 2015-05-18 DIAGNOSIS — N200 Calculus of kidney: Secondary | ICD-10-CM | POA: Diagnosis not present

## 2015-05-18 DIAGNOSIS — J45909 Unspecified asthma, uncomplicated: Secondary | ICD-10-CM | POA: Diagnosis not present

## 2015-05-18 DIAGNOSIS — Z720 Tobacco use: Secondary | ICD-10-CM | POA: Insufficient documentation

## 2015-05-18 DIAGNOSIS — Z79899 Other long term (current) drug therapy: Secondary | ICD-10-CM | POA: Diagnosis not present

## 2015-05-18 LAB — CBC WITH DIFFERENTIAL/PLATELET
BASOS ABS: 0 10*3/uL (ref 0.0–0.1)
BASOS PCT: 0 % (ref 0–1)
EOS ABS: 0.2 10*3/uL (ref 0.0–0.7)
Eosinophils Relative: 2 % (ref 0–5)
LYMPHS ABS: 2.5 10*3/uL (ref 0.7–4.0)
LYMPHS PCT: 22 % (ref 12–46)
Monocytes Absolute: 0.9 10*3/uL (ref 0.1–1.0)
Monocytes Relative: 8 % (ref 3–12)
NEUTROS ABS: 7.5 10*3/uL (ref 1.7–7.7)
NEUTROS PCT: 68 % (ref 43–77)

## 2015-05-18 LAB — CBC
HCT: 43.6 % (ref 36.0–46.0)
HEMOGLOBIN: 14.8 g/dL (ref 12.0–15.0)
MCH: 30.3 pg (ref 26.0–34.0)
MCHC: 33.9 g/dL (ref 30.0–36.0)
MCV: 89.3 fL (ref 78.0–100.0)
PLATELETS: 230 10*3/uL (ref 150–400)
RBC: 4.88 MIL/uL (ref 3.87–5.11)
RDW: 12.2 % (ref 11.5–15.5)
WBC: 11.1 10*3/uL — AB (ref 4.0–10.5)

## 2015-05-18 LAB — URINALYSIS, ROUTINE W REFLEX MICROSCOPIC
Bilirubin Urine: NEGATIVE
GLUCOSE, UA: NEGATIVE mg/dL
HGB URINE DIPSTICK: NEGATIVE
KETONES UR: NEGATIVE mg/dL
Leukocytes, UA: NEGATIVE
Nitrite: NEGATIVE
PROTEIN: 30 mg/dL — AB
SPECIFIC GRAVITY, URINE: 1.026 (ref 1.005–1.030)
Urobilinogen, UA: 0.2 mg/dL (ref 0.0–1.0)
pH: 8.5 — ABNORMAL HIGH (ref 5.0–8.0)

## 2015-05-18 LAB — COMPREHENSIVE METABOLIC PANEL
ALK PHOS: 47 U/L (ref 38–126)
ALT: 38 U/L (ref 14–54)
ANION GAP: 10 (ref 5–15)
AST: 26 U/L (ref 15–41)
Albumin: 4.1 g/dL (ref 3.5–5.0)
BUN: 11 mg/dL (ref 6–20)
CALCIUM: 9.4 mg/dL (ref 8.9–10.3)
CO2: 25 mmol/L (ref 22–32)
Chloride: 105 mmol/L (ref 101–111)
Creatinine, Ser: 1.17 mg/dL — ABNORMAL HIGH (ref 0.44–1.00)
GFR calc Af Amer: >60 mL/min (ref >60)
Glucose, Bld: 103 mg/dL — ABNORMAL HIGH (ref 65–99)
Potassium: 5 mmol/L (ref 3.5–5.1)
Sodium: 140 mmol/L (ref 135–145)
TOTAL PROTEIN: 7.5 g/dL (ref 6.5–8.1)
Total Bilirubin: 0.8 mg/dL (ref 0.3–1.2)

## 2015-05-18 LAB — LIPASE, BLOOD: Lipase: 22 U/L (ref 22–51)

## 2015-05-18 LAB — URINE MICROSCOPIC-ADD ON

## 2015-05-18 LAB — HCG, QUANTITATIVE, PREGNANCY: hCG, Beta Chain, Quant, S: 1 m[IU]/mL (ref <5)

## 2015-05-18 LAB — POC URINE PREG, ED: Preg Test, Ur: NEGATIVE

## 2015-05-18 MED ORDER — IOHEXOL 300 MG/ML  SOLN
100.0000 mL | Freq: Once | INTRAMUSCULAR | Status: AC | PRN
  Administered 2015-05-18: 100 mL via INTRAVENOUS

## 2015-05-18 MED ORDER — OXYCODONE-ACETAMINOPHEN 5-325 MG PO TABS: 2.0000 | ORAL_TABLET | Freq: Four times a day (QID) | ORAL | Status: DC | PRN

## 2015-05-18 MED ORDER — HYDROMORPHONE HCL 1 MG/ML IJ SOLN
1.0000 mg | Freq: Once | INTRAMUSCULAR | Status: AC
  Administered 2015-05-18: 1 mg via INTRAVENOUS
  Filled 2015-05-18: qty 1

## 2015-05-18 MED ORDER — KETOROLAC TROMETHAMINE 15 MG/ML IJ SOLN
15.0000 mg | Freq: Once | INTRAMUSCULAR | Status: AC
  Administered 2015-05-18: 15 mg via INTRAVENOUS
  Filled 2015-05-18: qty 1

## 2015-05-18 MED ORDER — ONDANSETRON HCL 4 MG PO TABS: 4.0000 mg | ORAL_TABLET | Freq: Four times a day (QID) | ORAL | Status: DC

## 2015-05-18 MED ORDER — ONDANSETRON HCL 4 MG/2ML IJ SOLN
4.0000 mg | Freq: Once | INTRAMUSCULAR | Status: AC | PRN
  Administered 2015-05-18: 4 mg via INTRAVENOUS
  Filled 2015-05-18: qty 2

## 2015-05-18 MED ORDER — FENTANYL CITRATE (PF) 100 MCG/2ML IJ SOLN
INTRAMUSCULAR | Status: AC
  Filled 2015-05-18: qty 2

## 2015-05-18 MED ORDER — FENTANYL CITRATE (PF) 100 MCG/2ML IJ SOLN
50.0000 ug | Freq: Once | INTRAMUSCULAR | Status: AC
  Administered 2015-05-18: 50 ug via INTRAVENOUS

## 2015-05-18 NOTE — ED Notes (Signed)
Patient transported to CT 

## 2015-05-18 NOTE — Discharge Instructions (Signed)

## 2015-05-18 NOTE — ED Notes (Signed)
Patient returned from CT scan moaning in pain.

## 2015-05-18 NOTE — ED Notes (Signed)
Pt. reports RLQ and right lower back pain with nausea and vomitting onset this week worse this morning , denies diarrhea , no fever or chills.

## 2015-05-18 NOTE — ED Provider Notes (Signed)
CSN: [id number]     Arrival date & time 05/18/15  0518 History   First MD Initiated Contact with Patient 05/18/15 [phone]     Chief Complaint  Patient presents with  . Abdominal Pain  . Back Pain     (Consider location/radiation/quality/duration/timing/severity/associated sxs/prior Treatment) HPI Comments: Patient presents to the emergency department with chief complaint of right lower quadrant pain. Patient states the pain started last night. She has had associated nausea and vomiting. She states that she has been having the pain intermittently for the past several weeks. She has a recent diagnosis of C. difficile colitis as well as kidney stone. She states that this does not feel the same. She denies having any fever or chills. She denies any diarrhea. States most of the abdominal pain is in her right lower quadrant. Patient also complains of a boil in her buttocks. There are no aggravating or alleviating factors.  The history is provided by the patient. No language interpreter was used.    Past Medical History  Diagnosis Date  . Impetigo   . Polycystic ovary syndrome   . Asthma   . H/O drug abuse   . GERD (gastroesophageal reflux disease)   . Constipation, chronic   . Depression    Past Surgical History  Procedure Laterality Date  . Pilonidal cyst drainage     No family history on file. History  Substance Use Topics  . Smoking status: Current Every Day Smoker    Types: Cigarettes  . Smokeless tobacco: Not on file  . Alcohol Use: Yes     Comment: socially   OB History    No data available     Review of Systems  Constitutional: Negative for fever and chills.  Respiratory: Negative for shortness of breath.   Cardiovascular: Negative for chest pain.  Gastrointestinal: Positive for nausea, vomiting and abdominal pain. Negative for diarrhea and constipation.  Genitourinary: Negative for dysuria.  All other systems reviewed and are negative.     Allergies  Review of  patient's allergies indicates no known allergies.  Home Medications   Prior to Admission medications   Medication Sig Start Date End Date Taking? Authorizing Provider  dicyclomine (BENTYL) 20 MG tablet Take 20 mg by mouth 3 (three) times daily before meals.   Yes Historical Provider, MD  ketorolac (TORADOL) 10 MG tablet Take 10 mg by mouth every 8 (eight) hours as needed for moderate pain.   Yes Historical Provider, MD  oxyCODONE-acetaminophen (PERCOCET/ROXICET) 5-325 MG per tablet Take 1 tablet by mouth every 4 (four) hours as needed for severe pain.   Yes Historical Provider, MD  pantoprazole (PROTONIX) 40 MG tablet Take 40 mg by mouth daily.   Yes Historical Provider, MD  vancomycin (VANCOCIN) 125 MG capsule Take 125 mg by mouth 4 (four) times daily.   Yes Historical Provider, MD  albuterol (PROVENTIL HFA;VENTOLIN HFA) 108 (90 BASE) MCG/ACT inhaler Inhale 1-2 puffs into the lungs every 6 (six) hours as needed for wheezing. Patient not taking: Reported on 05/18/2015 10/18/11 10/17/12  Forbes Cellar, MD  naproxen (NAPROSYN) 500 MG tablet Take 1 tablet (500 mg total) by mouth 2 (two) times daily. Patient not taking: Reported on 05/18/2015 01/20/13   Elson Areas, PA-C  naproxen sodium (ALEVE) 220 MG tablet Take 2 tablets every 12 hours until stone passes. Patient not taking: Reported on 05/18/2015 02/01/12   Paula Libra, MD  polyethylene glycol [facility]) packet Take 17 g by mouth daily. Patient not taking: Reported on  05/18/2015 01/20/13   Lonia Skinner Sofia, PA-C   BP 136/84 mmHg  Pulse 66  Temp(Src) 98.1 F (36.7 C) (Oral)  Resp 18  Ht 5\' 6"  (1.676 m)  Wt 250 lb (113.399 kg)  BMI 40.37 kg/m2  SpO2 97%  LMP  Physical Exam  Constitutional: She is oriented to person, place, and time. She appears well-developed and well-nourished.  HENT:  Head: Normocephalic and atraumatic.  Eyes: Conjunctivae and EOM are normal. Pupils are equal, round, and reactive to light.  Neck: Normal range of motion. Neck  supple.  Cardiovascular: Normal rate and regular rhythm.  Exam reveals no gallop and no friction rub.   No murmur heard. Pulmonary/Chest: Effort normal and breath sounds normal. No respiratory distress. She has no wheezes. She has no rales. She exhibits no tenderness.  Abdominal: Soft. Bowel sounds are normal. She exhibits no distension and no mass. There is tenderness. There is no rebound and no guarding.  Musculoskeletal: Normal range of motion. She exhibits no edema or tenderness.  Neurological: She is alert and oriented to person, place, and time.  Skin: Skin is warm and dry.  Very small (0.5 cm) area of induration on left buttocks, no drainage, it appears as though the head of a pustule has recently burst, there is no surrounding cellulitis, nothing requiring incision and drainage at this time, chaperone present during exam  Psychiatric: She has a normal mood and affect. Her behavior is normal. Judgment and thought content normal.  Nursing note and vitals reviewed.   ED Course  Procedures (including critical care time) Results for orders placed or performed during the hospital encounter of 05/18/15  Lipase, blood  Result Value Ref Range   Lipase 22 22 - 51 U/L  Comprehensive metabolic panel  Result Value Ref Range   Sodium 140 135 - 145 mmol/L   Potassium 5.0 3.5 - 5.1 mmol/L   Chloride 105 101 - 111 mmol/L   CO2 25 22 - 32 mmol/L   Glucose, Bld 103 (H) 65 - 99 mg/dL   BUN 11 6 - 20 mg/dL   Creatinine, Ser 7.84 (H) 0.44 - 1.00 mg/dL   Calcium 9.4 8.9 - 69.6 mg/dL   Total Protein 7.5 6.5 - 8.1 g/dL   Albumin 4.1 3.5 - 5.0 g/dL   AST 26 15 - 41 U/L   ALT 38 14 - 54 U/L   Alkaline Phosphatase 47 38 - 126 U/L   Total Bilirubin 0.8 0.3 - 1.2 mg/dL   GFR calc non Af Amer >60 >60 mL/min   GFR calc Af Amer >60 >60 mL/min   Anion gap 10 5 - 15  CBC  Result Value Ref Range   WBC 11.1 (H) 4.0 - 10.5 K/uL   RBC 4.88 3.87 - 5.11 MIL/uL   Hemoglobin 14.8 12.0 - 15.0 g/dL   HCT 29.5  28.4 - 13.2 %   MCV 89.3 78.0 - 100.0 fL   MCH 30.3 26.0 - 34.0 pg   MCHC 33.9 30.0 - 36.0 g/dL   RDW 44.0 10.2 - 72.5 %   Platelets 230 150 - 400 K/uL  Urinalysis, Routine w reflex microscopic (not at [facility])  Result Value Ref Range   Color, Urine YELLOW YELLOW   APPearance CLEAR CLEAR   Specific Gravity, Urine 1.026 1.005 - 1.030   pH 8.5 (H) 5.0 - 8.0   Glucose, UA NEGATIVE NEGATIVE mg/dL   Hgb urine dipstick NEGATIVE NEGATIVE   Bilirubin Urine NEGATIVE NEGATIVE   Ketones, ur  NEGATIVE NEGATIVE mg/dL   Protein, ur 30 (A) NEGATIVE mg/dL   Urobilinogen, UA 0.2 0.0 - 1.0 mg/dL   Nitrite NEGATIVE NEGATIVE   Leukocytes, UA NEGATIVE NEGATIVE  hCG, quantitative, pregnancy  Result Value Ref Range   hCG, Beta Chain, Quant, S 1 <5 mIU/mL  CBC with Differential/Platelet  Result Value Ref Range   WBC DUPLICATE REQUEST SEE [id number] 4.0 - 10.5 K/uL   RBC DUPLICATE REQUEST SEE [id number] 3.87 - 5.11 MIL/uL   Hemoglobin DUPLICATE REQUEST SEE [id number] 12.0 - 15.0 g/dL   HCT DUPLICATE REQUEST SEE [id number] 36.0 - 46.0 %   MCV DUPLICATE REQUEST SEE [id number] 78.0 - 100.0 fL   MCH DUPLICATE REQUEST SEE [id number] 26.0 - 34.0 pg   MCHC DUPLICATE REQUEST SEE [id number] 30.0 - 36.0 g/dL   RDW DUPLICATE REQUEST SEE [id number] 11.5 - 15.5 %   Platelets DUPLICATE REQUEST SEE [id number] 150 - 400 K/uL   Neutrophils Relative % 68 43 - 77 %   Neutro Abs 7.5 1.7 - 7.7 K/uL   Lymphocytes Relative 22 12 - 46 %   Lymphs Abs 2.5 0.7 - 4.0 K/uL   Monocytes Relative 8 3 - 12 %   Monocytes Absolute 0.9 0.1 - 1.0 K/uL   Eosinophils Relative 2 0 - 5 %   Eosinophils Absolute 0.2 0.0 - 0.7 K/uL   Basophils Relative 0 0 - 1 %   Basophils Absolute 0.0 0.0 - 0.1 K/uL  Urine microscopic-add on  Result Value Ref Range   Squamous Epithelial / LPF MANY (A) RARE   WBC, UA 0-2 <3 WBC/hpf   RBC / HPF 0-2 <3 RBC/hpf   Bacteria, UA RARE RARE  POC urine preg, ED (not at [facility])  Result Value Ref Range   Preg Test, Ur NEGATIVE NEGATIVE   Ct Abdomen  Pelvis W Contrast  05/18/2015   CLINICAL DATA:  One day history of right lower quadrant pain with nausea and vomiting.  EXAM: CT ABDOMEN AND PELVIS WITH CONTRAST  TECHNIQUE: Multidetector CT imaging of the abdomen and pelvis was performed using the standard protocol following bolus administration of intravenous contrast.  CONTRAST:  OMNIPAQUE IOHEXOL 300 MG/ML  SOLN  COMPARISON:  April 14, 2015  FINDINGS: There is slight bibasilar atelectatic change.  No focal liver lesions are identified. Gallbladder wall is not thickened. There is no appreciable biliary duct dilatation.  Spleen, pancreas, and adrenals appear normal.  Left kidney shows no evidence of mass or hydronephrosis. There is no left-sided renal or ureteral calculus.  On the right, there is moderate hydronephrosis. There is mild delayed enhancement of the right kidney compared to the left. Right kidney is subtly edematous. There is no right renal mass. There is no intrarenal calculus on the right. There is a 3 mm calculus at the right ureterovesical junction. No other ureteral calculi are appreciable.  In the pelvis, the urinary bladder is midline with normal wall thickness. There is no pelvic mass or pelvic fluid collection. The appendix appears within normal limits.  There is no bowel obstruction.  No free air or portal venous air.  There is no ascites, adenopathy, or abscess in the abdomen or pelvis. Aorta appears unremarkable. There are no blastic or lytic bone lesions.  IMPRESSION: 3 mm calculus right ureterovesical junction with moderate hydronephrosis on the right. Right kidney is mildly edematous with delayed enhancement on the right compared to the normal appearance on the left.  Appendix appears normal.  No bowel obstruction.  No abscess.  Electronically Signed   By: Bretta Bang III M.D.   On: 05/18/2015 08:01      EKG Interpretation None      MDM   Final diagnoses:  RLQ abdominal pain  RLQ abdominal pain    Patient with  right lower quadrant pain. Onset last night with associated nausea and vomiting. Will check CT abdomen, labs, treat pain, and will reassess.  CT remarkable for 3 mm right-sided ureteral stone. Will discharge home with Percocet and Zofran. Pain is adequately controlled in the emergency department. Patient and parent understand and agree with the plan to follow-up with urology or return if symptoms worsen.  Vital signs are stable. Doubt UTI. Culture urine and discharged home.    Roxy Horseman, PA-C 05/18/15 1610  Marisa Severin, MD 05/19/15 [phone]

## 2015-05-28 ENCOUNTER — Observation Stay (HOSPITAL_COMMUNITY)
Admission: EM | Admit: 2015-05-28 | Discharge: 2015-05-28 | Disposition: A | Discharge status: Home / Self Care | Payer: BLUE CROSS/BLUE SHIELD | Attending: Urology | Admitting: Urology

## 2015-05-28 ENCOUNTER — Emergency Department (HOSPITAL_COMMUNITY): Payer: BLUE CROSS/BLUE SHIELD

## 2015-05-28 ENCOUNTER — Encounter (HOSPITAL_COMMUNITY)
Admission: EM | Disposition: A | Discharge status: Home / Self Care | Payer: Self-pay | Source: Home / Self Care | Attending: Emergency Medicine

## 2015-05-28 ENCOUNTER — Encounter (HOSPITAL_COMMUNITY): Payer: Self-pay | Admitting: Emergency Medicine

## 2015-05-28 ENCOUNTER — Observation Stay (HOSPITAL_COMMUNITY): Payer: BLUE CROSS/BLUE SHIELD | Admitting: Certified Registered Nurse Anesthetist

## 2015-05-28 DIAGNOSIS — K219 Gastro-esophageal reflux disease without esophagitis: Secondary | ICD-10-CM | POA: Diagnosis not present

## 2015-05-28 DIAGNOSIS — F329 Major depressive disorder, single episode, unspecified: Secondary | ICD-10-CM | POA: Diagnosis not present

## 2015-05-28 DIAGNOSIS — N132 Hydronephrosis with renal and ureteral calculous obstruction: Secondary | ICD-10-CM | POA: Diagnosis not present

## 2015-05-28 DIAGNOSIS — B9689 Other specified bacterial agents as the cause of diseases classified elsewhere: Secondary | ICD-10-CM | POA: Insufficient documentation

## 2015-05-28 DIAGNOSIS — N200 Calculus of kidney: Secondary | ICD-10-CM

## 2015-05-28 DIAGNOSIS — F17210 Nicotine dependence, cigarettes, uncomplicated: Secondary | ICD-10-CM | POA: Insufficient documentation

## 2015-05-28 DIAGNOSIS — J45909 Unspecified asthma, uncomplicated: Secondary | ICD-10-CM | POA: Insufficient documentation

## 2015-05-28 DIAGNOSIS — L0100 Impetigo, unspecified: Secondary | ICD-10-CM | POA: Diagnosis not present

## 2015-05-28 DIAGNOSIS — Z6841 Body Mass Index (BMI) 40.0 and over, adult: Secondary | ICD-10-CM | POA: Diagnosis not present

## 2015-05-28 DIAGNOSIS — E282 Polycystic ovarian syndrome: Secondary | ICD-10-CM | POA: Diagnosis not present

## 2015-05-28 DIAGNOSIS — K5900 Constipation, unspecified: Secondary | ICD-10-CM | POA: Insufficient documentation

## 2015-05-28 HISTORY — DX: Other bacterial infections of unspecified site: A49.8

## 2015-05-28 HISTORY — PX: CYSTOSCOPY/URETEROSCOPY/HOLMIUM LASER/STENT PLACEMENT: SHX6546

## 2015-05-28 LAB — URINALYSIS, ROUTINE W REFLEX MICROSCOPIC
BILIRUBIN URINE: NEGATIVE
Glucose, UA: NEGATIVE mg/dL
HGB URINE DIPSTICK: NEGATIVE
KETONES UR: NEGATIVE mg/dL
NITRITE: NEGATIVE
PROTEIN: NEGATIVE mg/dL
Specific Gravity, Urine: 1.018 (ref 1.005–1.030)
Urobilinogen, UA: 0.2 mg/dL (ref 0.0–1.0)
pH: 6.5 (ref 5.0–8.0)

## 2015-05-28 LAB — URINE MICROSCOPIC-ADD ON

## 2015-05-28 LAB — I-STAT CHEM 8, ED
BUN: 11 mg/dL (ref 6–20)
CREATININE: 1.1 mg/dL — AB (ref 0.44–1.00)
Calcium, Ion: 1.16 mmol/L (ref 1.12–1.23)
Chloride: 106 mmol/L (ref 101–111)
Glucose, Bld: 101 mg/dL — ABNORMAL HIGH (ref 65–99)
HEMATOCRIT: 41 % (ref 36.0–46.0)
HEMOGLOBIN: 13.9 g/dL (ref 12.0–15.0)
Potassium: 4 mmol/L (ref 3.5–5.1)
SODIUM: 141 mmol/L (ref 135–145)
TCO2: 21 mmol/L (ref 0–100)

## 2015-05-28 LAB — POC URINE PREG, ED: PREG TEST UR: NEGATIVE

## 2015-05-28 SURGERY — CYSTOSCOPY/URETEROSCOPY/HOLMIUM LASER/STENT PLACEMENT
Anesthesia: General | Site: Ureter | Laterality: Right

## 2015-05-28 MED ORDER — LACTATED RINGERS IV SOLN: INTRAVENOUS | Status: DC

## 2015-05-28 MED ORDER — SODIUM CHLORIDE 0.9 % IV BOLUS (SEPSIS)
1000.0000 mL | Freq: Once | INTRAVENOUS | Status: AC
  Administered 2015-05-28: 1000 mL via INTRAVENOUS

## 2015-05-28 MED ORDER — CEFAZOLIN SODIUM-DEXTROSE 2-3 GM-% IV SOLR
2.0000 g | INTRAVENOUS | Status: AC
  Administered 2015-05-28: 2 g via INTRAVENOUS

## 2015-05-28 MED ORDER — PROPOFOL 10 MG/ML IV BOLUS
INTRAVENOUS | Status: AC
  Filled 2015-05-28: qty 20

## 2015-05-28 MED ORDER — MIDAZOLAM HCL 2 MG/2ML IJ SOLN
INTRAMUSCULAR | Status: AC
  Filled 2015-05-28: qty 4

## 2015-05-28 MED ORDER — ALBUTEROL SULFATE HFA 108 (90 BASE) MCG/ACT IN AERS
1.0000 | INHALATION_SPRAY | Freq: Four times a day (QID) | RESPIRATORY_TRACT | Status: DC | PRN
  Filled 2015-05-28: qty 6.7

## 2015-05-28 MED ORDER — SUCCINYLCHOLINE CHLORIDE 20 MG/ML IJ SOLN
INTRAMUSCULAR | Status: DC | PRN
  Administered 2015-05-28: 140 mg via INTRAVENOUS

## 2015-05-28 MED ORDER — CEFAZOLIN SODIUM-DEXTROSE 2-3 GM-% IV SOLR
INTRAVENOUS | Status: AC
  Filled 2015-05-28: qty 50

## 2015-05-28 MED ORDER — FENTANYL CITRATE (PF) 100 MCG/2ML IJ SOLN
50.0000 ug | Freq: Once | INTRAMUSCULAR | Status: AC
  Administered 2015-05-28: 50 ug via NASAL
  Filled 2015-05-28: qty 2

## 2015-05-28 MED ORDER — HYDROMORPHONE HCL 1 MG/ML IJ SOLN
0.5000 mg | INTRAMUSCULAR | Status: DC | PRN
  Administered 2015-05-28 (×3): 1 mg via INTRAVENOUS
  Filled 2015-05-28 (×4): qty 1

## 2015-05-28 MED ORDER — SODIUM CHLORIDE 0.9 % IR SOLN
Status: DC | PRN
  Administered 2015-05-28: 1000 mL
  Administered 2015-05-28: 3000 mL

## 2015-05-28 MED ORDER — PHENYLEPHRINE HCL 10 MG/ML IJ SOLN
INTRAMUSCULAR | Status: DC | PRN
  Administered 2015-05-28: 40 ug via INTRAVENOUS

## 2015-05-28 MED ORDER — KETOROLAC TROMETHAMINE 30 MG/ML IJ SOLN
30.0000 mg | Freq: Once | INTRAMUSCULAR | Status: AC
  Administered 2015-05-28: 30 mg via INTRAVENOUS
  Filled 2015-05-28: qty 1

## 2015-05-28 MED ORDER — FENTANYL CITRATE (PF) 100 MCG/2ML IJ SOLN
INTRAMUSCULAR | Status: DC | PRN
  Administered 2015-05-28 (×2): 50 ug via INTRAVENOUS

## 2015-05-28 MED ORDER — HYDROMORPHONE HCL 2 MG/ML IJ SOLN
2.0000 mg | Freq: Once | INTRAMUSCULAR | Status: AC
  Administered 2015-05-28: 2 mg via INTRAVENOUS
  Filled 2015-05-28: qty 1

## 2015-05-28 MED ORDER — DEXTROSE-NACL 5-0.9 % IV SOLN
INTRAVENOUS | Status: DC
  Administered 2015-05-28: 08:00:00 via INTRAVENOUS

## 2015-05-28 MED ORDER — ONDANSETRON HCL 4 MG/2ML IJ SOLN
INTRAMUSCULAR | Status: DC | PRN
  Administered 2015-05-28: 4 mg via INTRAVENOUS

## 2015-05-28 MED ORDER — PANTOPRAZOLE SODIUM 40 MG PO TBEC
40.0000 mg | DELAYED_RELEASE_TABLET | Freq: Every day | ORAL | Status: DC
  Administered 2015-05-28: 40 mg via ORAL
  Filled 2015-05-28: qty 1

## 2015-05-28 MED ORDER — DIPHENHYDRAMINE HCL 12.5 MG/5ML PO ELIX: 12.5000 mg | ORAL_SOLUTION | Freq: Four times a day (QID) | ORAL | Status: DC | PRN

## 2015-05-28 MED ORDER — DIPHENHYDRAMINE HCL 50 MG/ML IJ SOLN: 12.5000 mg | Freq: Four times a day (QID) | INTRAMUSCULAR | Status: DC | PRN

## 2015-05-28 MED ORDER — ONDANSETRON HCL 4 MG/2ML IJ SOLN
4.0000 mg | Freq: Once | INTRAMUSCULAR | Status: AC
  Administered 2015-05-28: 4 mg via INTRAVENOUS
  Filled 2015-05-28: qty 2

## 2015-05-28 MED ORDER — KETOROLAC TROMETHAMINE 15 MG/ML IJ SOLN
15.0000 mg | Freq: Four times a day (QID) | INTRAMUSCULAR | Status: DC
  Administered 2015-05-28: 15 mg via INTRAVENOUS
  Filled 2015-05-28 (×2): qty 1

## 2015-05-28 MED ORDER — PROPOFOL 10 MG/ML IV BOLUS
INTRAVENOUS | Status: DC | PRN
  Administered 2015-05-28: 200 mg via INTRAVENOUS

## 2015-05-28 MED ORDER — ALBUTEROL SULFATE HFA 108 (90 BASE) MCG/ACT IN AERS: 1.0000 | INHALATION_SPRAY | Freq: Four times a day (QID) | RESPIRATORY_TRACT | Status: DC | PRN

## 2015-05-28 MED ORDER — MIDAZOLAM HCL 5 MG/5ML IJ SOLN
INTRAMUSCULAR | Status: DC | PRN
Start: 2015-05-28 — End: 2015-05-28
  Administered 2015-05-28: 2 mg via INTRAVENOUS

## 2015-05-28 MED ORDER — MORPHINE SULFATE 4 MG/ML IJ SOLN
6.0000 mg | Freq: Once | INTRAMUSCULAR | Status: AC
  Administered 2015-05-28: 6 mg via INTRAVENOUS
  Filled 2015-05-28: qty 2

## 2015-05-28 MED ORDER — DEXAMETHASONE SODIUM PHOSPHATE 10 MG/ML IJ SOLN
INTRAMUSCULAR | Status: DC | PRN
  Administered 2015-05-28: 10 mg via INTRAVENOUS

## 2015-05-28 MED ORDER — ALBUTEROL SULFATE HFA 108 (90 BASE) MCG/ACT IN AERS
INHALATION_SPRAY | RESPIRATORY_TRACT | Status: DC | PRN
  Administered 2015-05-28: 4 via RESPIRATORY_TRACT

## 2015-05-28 MED ORDER — LIDOCAINE HCL (CARDIAC) 20 MG/ML IV SOLN
INTRAVENOUS | Status: DC | PRN
  Administered 2015-05-28: 100 mg via INTRAVENOUS

## 2015-05-28 MED ORDER — FENTANYL CITRATE (PF) 100 MCG/2ML IJ SOLN
INTRAMUSCULAR | Status: AC
  Filled 2015-05-28: qty 4

## 2015-05-28 MED ORDER — LACTATED RINGERS IV SOLN
INTRAVENOUS | Status: DC | PRN
  Administered 2015-05-28: 14:00:00 via INTRAVENOUS

## 2015-05-28 MED ORDER — DICYCLOMINE HCL 20 MG PO TABS
20.0000 mg | ORAL_TABLET | Freq: Three times a day (TID) | ORAL | Status: DC
  Filled 2015-05-28 (×3): qty 1

## 2015-05-28 MED ORDER — ONDANSETRON HCL 4 MG/2ML IJ SOLN
4.0000 mg | INTRAMUSCULAR | Status: DC | PRN
  Filled 2015-05-28: qty 2

## 2015-05-28 MED ORDER — VANCOMYCIN 50 MG/ML ORAL SOLUTION
125.0000 mg | Freq: Four times a day (QID) | ORAL | Status: DC
  Administered 2015-05-28: 125 mg via ORAL
  Filled 2015-05-28 (×6): qty 2.5

## 2015-05-28 MED ORDER — VANCOMYCIN HCL 125 MG PO CAPS: 125.0000 mg | ORAL_CAPSULE | Freq: Four times a day (QID) | ORAL | Status: DC

## 2015-05-28 MED ORDER — OXYCODONE-ACETAMINOPHEN 5-325 MG PO TABS: 1.0000 | ORAL_TABLET | ORAL | Status: DC | PRN

## 2015-05-28 MED ORDER — IOHEXOL 300 MG/ML  SOLN
INTRAMUSCULAR | Status: DC | PRN
Start: 2015-05-28 — End: 2015-05-28
  Administered 2015-05-28: 2 mL

## 2015-05-28 MED ORDER — HYDROMORPHONE HCL 1 MG/ML IJ SOLN
1.0000 mg | INTRAMUSCULAR | Status: AC | PRN
  Administered 2015-05-28 (×2): 1 mg via INTRAVENOUS
  Filled 2015-05-28 (×2): qty 1

## 2015-05-28 MED ORDER — ALBUTEROL SULFATE (2.5 MG/3ML) 0.083% IN NEBU: 2.5000 mg | INHALATION_SOLUTION | Freq: Four times a day (QID) | RESPIRATORY_TRACT | Status: DC | PRN

## 2015-05-28 MED ORDER — CEFAZOLIN SODIUM 1-5 GM-% IV SOLN: 1.0000 g | Freq: Once | INTRAVENOUS | Status: DC

## 2015-05-28 SURGICAL SUPPLY — 19 items
BAG URO CATCHER STRL LF (DRAPE) ×3 IMPLANT
BASKET ZERO TIP NITINOL 2.4FR (BASKET) ×2 IMPLANT
BSKT STON RTRVL ZERO TP 2.4FR (BASKET) ×1
CATH INTERMIT  6FR 70CM (CATHETERS) ×2 IMPLANT
CLOTH BEACON ORANGE TIMEOUT ST (SAFETY) ×3 IMPLANT
FIBER LASER FLEXIVA 200 (UROLOGICAL SUPPLIES) IMPLANT
FIBER LASER FLEXIVA 365 (UROLOGICAL SUPPLIES) ×2 IMPLANT
FIBER LASER TRAC TIP (UROLOGICAL SUPPLIES) IMPLANT
GLOVE BIOGEL M STRL SZ7.5 (GLOVE) ×3 IMPLANT
GOWN STRL REUS W/TWL LRG LVL3 (GOWN DISPOSABLE) ×6 IMPLANT
GUIDEWIRE ANG ZIPWIRE 038X150 (WIRE) IMPLANT
GUIDEWIRE STR DUAL SENSOR (WIRE) ×5 IMPLANT
IV NS 1000ML (IV SOLUTION) ×3
IV NS 1000ML BAXH (IV SOLUTION) ×1 IMPLANT
MANIFOLD NEPTUNE II (INSTRUMENTS) ×3 IMPLANT
PACK CYSTO (CUSTOM PROCEDURE TRAY) ×3 IMPLANT
SHEATH ACCESS URETERAL 38CM (SHEATH) IMPLANT
TUBING CONNECTING 10 (TUBING) ×2 IMPLANT
TUBING CONNECTING 10' (TUBING) ×1

## 2015-05-28 NOTE — ED Notes (Signed)
Pt is crying , states she was diagnosed two weeks ago with 3 mm kidney stone and now is having trouble urinating,  Pt drove herself here , says her mom will be here soon,  She is at [facility] taking care of pt and doesn't have relief,  Pt is on knees bawling now,

## 2015-05-28 NOTE — ED Provider Notes (Addendum)
CSN: [id number]   Arrival date & time 05/28/15 0010  History  This chart was scribed for  Tomasita Crumble, MD by Bethel Born, ED Scribe. This patient was seen in room WA23/WA23 and the patient's care was started at 1:00 AM.  Chief Complaint  Patient presents with  . Nephrolithiasis  . Urinary Retention  . Back Pain    HPI The history is provided by the patient. No language interpreter was used.   Cassie Blake is a 24 y.o. female who presents to the Emergency Department complaining of severe lower back pain with gradual onset this evening. She rates the pain 10/10 in severity and describes it as sharp.  Pt was seen in the ED on 05/18/15 for RLQ pain, nausea, and vomiting. At that time she had a CT scan showing a 3 mm right-sided ureteral stone. Earlier this  week she was seen by urology and given Flomax. Associated symptoms include nausea, vomiting, lower abdominal pain, and dysuria. Pt denies change in urine color. She is on vancomycin for C. Diff.   Past Medical History  Diagnosis Date  . Impetigo   . Polycystic ovary syndrome   . Asthma   . H/O drug abuse   . GERD (gastroesophageal reflux disease)   . Constipation, chronic   . Depression   . Clostridium difficile infection     being treated currently    Past Surgical History  Procedure Laterality Date  . Pilonidal cyst drainage      No family history on file.  Social History  Substance Use Topics  . Smoking status: Current Every Day Smoker    Types: Cigarettes  . Smokeless tobacco: None  . Alcohol Use: Yes     Comment: socially     Review of Systems 10 Systems reviewed and all are negative for acute change except as noted in the HPI.  Home Medications   Prior to Admission medications   Medication Sig Start Date End Date Taking? Authorizing Provider  albuterol (PROVENTIL HFA;VENTOLIN HFA) 108 (90 BASE) MCG/ACT inhaler Inhale 1-2 puffs into the lungs every 6 (six) hours as needed for wheezing. Patient not taking:  Reported on 05/18/2015 10/18/11 10/17/12  Forbes Cellar, MD  dicyclomine (BENTYL) 20 MG tablet Take 20 mg by mouth 3 (three) times daily before meals.    Historical Provider, MD  ketorolac (TORADOL) 10 MG tablet Take 10 mg by mouth every 8 (eight) hours as needed for moderate pain.    Historical Provider, MD  naproxen (NAPROSYN) 500 MG tablet Take 1 tablet (500 mg total) by mouth 2 (two) times daily. Patient not taking: Reported on 05/18/2015 01/20/13   Elson Areas, PA-C  naproxen sodium (ALEVE) 220 MG tablet Take 2 tablets every 12 hours until stone passes. Patient not taking: Reported on 05/18/2015 02/01/12   Paula Libra, MD  ondansetron (ZOFRAN) 4 MG tablet Take 1 tablet (4 mg total) by mouth every 6 (six) hours. 05/18/15   Roxy Horseman, PA-C  oxyCODONE-acetaminophen (PERCOCET/ROXICET) 5-325 MG per tablet Take 2 tablets by mouth every 6 (six) hours as needed for severe pain. 05/18/15   Roxy Horseman, PA-C  pantoprazole (PROTONIX) 40 MG tablet Take 40 mg by mouth daily.    Historical Provider, MD  polyethylene glycol (MIRALAX) packet Take 17 g by mouth daily. Patient not taking: Reported on 05/18/2015 01/20/13   Elson Areas, PA-C  vancomycin (VANCOCIN) 125 MG capsule Take 125 mg by mouth 4 (four) times daily.    Historical Provider, MD  Allergies  Review of patient's allergies indicates no known allergies.  Triage Vitals: BP 148/92 mmHg  Pulse 97  Temp(Src) 97.8 F (36.6 C) (Oral)  Resp 20  SpO2 100%  Physical Exam  Constitutional: She is oriented to person, place, and time. She appears well-developed and well-nourished. She appears distressed.  Tearful and crying on exam  HENT:  Head: Normocephalic and atraumatic.  Nose: Nose normal.  Mouth/Throat: Oropharynx is clear and moist. No oropharyngeal exudate.  Eyes: Conjunctivae and EOM are normal. Pupils are equal, round, and reactive to light. No scleral icterus.  Neck: Normal range of motion. Neck supple. No JVD present. No tracheal  deviation present. No thyromegaly present.  Cardiovascular: Normal rate, regular rhythm and normal heart sounds.  Exam reveals no gallop and no friction rub.   No murmur heard. Pulmonary/Chest: Effort normal and breath sounds normal. No respiratory distress. She has no wheezes. She exhibits no tenderness.  Abdominal: Soft. Bowel sounds are normal. She exhibits no distension and no mass. There is no tenderness. There is no rebound and no guarding.  Musculoskeletal: Normal range of motion. She exhibits no edema or tenderness.  Lymphadenopathy:    She has no cervical adenopathy.  Neurological: She is alert and oriented to person, place, and time. No cranial nerve deficit. She exhibits normal muscle tone.  Skin: Skin is warm and dry. No rash noted. No erythema. No pallor.  Nursing note and vitals reviewed.   ED Course  Procedures   DIAGNOSTIC STUDIES: Oxygen Saturation is 100% on RA, normal by my interpretation.    COORDINATION OF CARE: 1:05 AM Discussed treatment plan which includes lab work, pain management, and IVF with pt at bedside and pt agreed to plan.  Labs Review-  Labs Reviewed  URINALYSIS, ROUTINE W REFLEX MICROSCOPIC (NOT AT [facility]) - Abnormal; Notable for the following:    Leukocytes, UA TRACE (*)    All other components within normal limits  I-STAT CHEM 8, ED - Abnormal; Notable for the following:    Creatinine, Ser 1.10 (*)    Glucose, Bld 101 (*)    All other components within normal limits  URINE MICROSCOPIC-ADD ON  POC URINE PREG, ED    Imaging Review US Renal  05/28/2015   CLINICAL DATA:  Right-sided hydronephrosis.  EXAM: RENAL / URINARY TRACT ULTRASOUND COMPLETE  COMPARISON:  None.  FINDINGS: Right Kidney:  Length: 13.8 cm, within normal limits. Moderate right-sided hydronephrosis is again seen.  Left Kidney:  Length: 13.2 cm. Echogenicity within normal limits. No mass or hydronephrosis visualized.  Bladder:  Bilateral ureteral jets are visualized. The bladder is  unremarkable.  IMPRESSION: 1. Persistent moderate right-sided hydronephrosis. 2. Normal appearance of the left kidney.   Electronically Signed   By: Marin Roberts M.D.   On: 05/28/2015 02:28    EKG Interpretation None      MDM   Final diagnoses:  None   Patient presents emergency department for recurrent pain for kidney stone. She states this is consistent with her kidney stone pain. She was sent home with Percocet however this has not helped her pain. She states she is having some urinary retention as well.  She was given IV fluids, will obtain a creatinine level and urinalysis. She was given morphine and Toradol for pain control. Also Zofran for nausea relief. Will continue to give 2 doses of Dilaudid to help treat her pain. Plan to reassess the patient. Repeat ultrasound reveals similar hydronephrosis as prior. Creatinine is unchanged. There is no  infection in the urinalysis.  Patient states that pain despite multiple doses of Dilaudid, morphine, Toradol. Mother is now on the room, she does say she has been to 2 other emergency departments over the past couple of weeks for the same thing. She is not comfortable going home with the patient due to lack of pain control. Patient also does have remote history of IV drug use. I have paged Dr. Laverle Patter with urology for admission. He will come to emergency department to evaluate the patient.  I personally performed the services described in this documentation, which was scribed in my presence. The recorded information has been reviewed and is accurate.      Tomasita Crumble, MD 05/28/15 [phone]

## 2015-05-28 NOTE — ED Notes (Signed)
Pt hasn't quantified pain but she is moaning and crying at end of bed kneeled down on floor

## 2015-05-28 NOTE — ED Notes (Signed)
Leida Luton mother [phone]

## 2015-05-28 NOTE — Anesthesia Postprocedure Evaluation (Signed)
  Anesthesia Post-op Note  Patient: Cassie Blake  Procedure(s) Performed: Procedure(s) (LRB): CYSTOSCOPY/RIGHT RETROGRADE PYELOGRAM/ RIGHT URETEROSCOPY/STONE BASKETRY/HOLMIUM LASER APPLICATION (Right)  Patient Location: PACU  Anesthesia Type: General  Level of Consciousness: awake and alert   Airway and Oxygen Therapy: Patient Spontanous Breathing  Post-op Pain: mild  Post-op Assessment: Post-op Vital signs reviewed, Patient's Cardiovascular Status Stable, Respiratory Function Stable, Patent Airway and No signs of Nausea or vomiting  Last Vitals:  Filed Vitals:   05/28/15 1530  BP: 117/80  Pulse: 59  Temp: 36.4 C  Resp: 16    Post-op Vital Signs: stable   Complications: No apparent anesthesia complications

## 2015-05-28 NOTE — Op Note (Signed)
Preoperative diagnosis: Right ureteral calculus  Postoperative diagnosis: Right ureteral calculus  Procedure:  1. Cystoscopy 2. Right ureteroscopy and stone removal 3. Ureteroscopic laser lithotripsy 4. Right retrograde pyelography with interpretation  Surgeon: Moody Bruins. M.D.  Anesthesia: General  Complications: None  Intraoperative findings: Right retrograde pyelography demonstrated a filling defect within the distal right ureter consistent with the patient's known calculus without other abnormalities.  EBL: Minimal  Specimens: 1. Right ureteral calculus  Disposition of specimens: [facility] for stone analysis  Indication: Cassie Blake is a 24 y.o. year old patient with urolithiasis. After reviewing the management options for treatment, the patient elected to proceed with the above surgical procedure(s). We have discussed the potential benefits and risks of the procedure, side effects of the proposed treatment, the likelihood of the patient achieving the goals of the procedure, and any potential problems that might occur during the procedure or recuperation. Informed consent has been obtained.  Description of procedure:  The patient was taken to the operating room and general anesthesia was induced.  The patient was placed in the dorsal lithotomy position, prepped and draped in the usual sterile fashion, and preoperative antibiotics were administered. A preoperative time-out was performed.   Cystourethroscopy was performed.  The patient's urethra was examined and was normal. The bladder was then systematically examined in its entirety. There was no evidence for any bladder tumors, stones, or other mucosal pathology.    Attention then turned to the right ureteral orifice and a ureteral catheter was used to intubate the ureteral orifice.  Omnipaque contrast was injected through the ureteral catheter and a retrograde pyelogram was performed with findings  as dictated above.  A 0.38 sensor guidewire was then advanced up the right ureter into the renal pelvis under fluoroscopic guidance. The 6 Fr semirigid ureteroscope was then advanced into the ureter next to the guidewire and the calculus was identified.   The stone was then fragmented with the 365 micron holmium laser fiber on a setting of 0.6 J and frequency of 6 Hz.   All stones were then removed from the ureter with a zero tip nitinol basket.  Reinspection of the ureter revealed no remaining visible stones or fragments.   No stent was felt to be necessary due to minimal edema and the atraumatic nature of the procedure.  The bladder was then emptied and the procedure ended.  The patient appeared to tolerate the procedure well and without complications.  The patient was able to be awakened and transferred to the recovery unit in satisfactory condition.

## 2015-05-28 NOTE — Discharge Instructions (Signed)
1. You may see some blood in the urine and may have some burning with urination for 48-72 hours. You also may notice that you have to urinate more frequently or urgently after your procedure which is normal.  °2. You should call should you develop an inability urinate, fever > 101, persistent nausea and vomiting that prevents you from eating or drinking to stay hydrated.  °

## 2015-05-28 NOTE — ED Notes (Signed)
Pt reports lower back pain and difficulty urinating. She was Dx with a kidney stone 3mm in size.

## 2015-05-28 NOTE — Discharge Summary (Signed)
  Date of admission: 05/28/2015  Date of discharge: 05/28/2015  Admission diagnosis: Right ureteral stone  Discharge diagnosis: Right ureteral stone  History and Physical: For full details, please see admission history and physical. Briefly, Cassie Blake is a 24 y.o. year old patient with a distal right ureteral stone and uncontrolled pain.   Hospital Course: She was admitted to the hospital for observation and pain control until the OR was available later that day.  She then underwent ureteroscopic laser lithotripsy and stone removal.  She was discharge home from the PACU.  Laboratory values:  Recent Labs  05/28/15 0127  HGB 13.9  HCT 41.0    Recent Labs  05/28/15 0127  CREATININE 1.10*    Disposition: Home  Discharge instruction: No restrictions.  Discharge medications:    Medication List    TAKE these medications        albuterol 108 (90 BASE) MCG/ACT inhaler  Commonly known as:  PROVENTIL HFA;VENTOLIN HFA  Inhale 1-2 puffs into the lungs every 6 (six) hours as needed for wheezing.     dicyclomine 20 MG tablet  Commonly known as:  BENTYL  Take 20 mg by mouth 3 (three) times daily before meals.     ketorolac 10 MG tablet  Commonly known as:  TORADOL  Take 10 mg by mouth every 8 (eight) hours as needed for moderate pain.     naproxen 500 MG tablet  Commonly known as:  NAPROSYN  Take 1 tablet (500 mg total) by mouth 2 (two) times daily.     naproxen sodium 220 MG tablet  Commonly known as:  ALEVE  Take 2 tablets every 12 hours until stone passes.     ondansetron 4 MG tablet  Commonly known as:  ZOFRAN  Take 1 tablet (4 mg total) by mouth every 6 (six) hours.     oxyCODONE-acetaminophen 5-325 MG per tablet  Commonly known as:  PERCOCET/ROXICET  Take 2 tablets by mouth every 6 (six) hours as needed for severe pain.     pantoprazole 40 MG tablet  Commonly known as:  PROTONIX  Take 40 mg by mouth daily.     polyethylene glycol packet  Commonly known as:   MIRALAX  Take 17 g by mouth daily.     RA PROBIOTIC COMPLEX Caps  Take 1 capsule by mouth daily.     silodosin 8 MG Caps capsule  Commonly known as:  RAPAFLO  Take 8 mg by mouth daily with breakfast.     solifenacin 10 MG tablet  Commonly known as:  VESICARE  Take 10 mg by mouth daily.     vancomycin 125 MG capsule  Commonly known as:  VANCOCIN  Take 125 mg by mouth 4 (four) times daily.        Followup:      Follow-up Information    Follow up with Garnett Farm, MD.   Specialty:  Urology   Why:  As scheduled   Contact information:   [address] [place] [address] [phone]

## 2015-05-28 NOTE — ED Notes (Signed)
Spoke with Dr Laverle Patter,  Consent signed and in black basket on wall at pt's bedside,  Dr Laverle Patter to put in holding orders,  Pt is nauseated and vomited,. Please call her mother at [phone] as soon as known time for surgery

## 2015-05-28 NOTE — Transfer of Care (Signed)
Immediate Anesthesia Transfer of Care Note  Patient: Cassie Blake  Procedure(s) Performed: Procedure(s): CYSTOSCOPY/RIGHT RETROGRADE PYELOGRAM/ RIGHT URETEROSCOPY/STONE BASKETRY/HOLMIUM LASER APPLICATION (Right)  Patient Location: PACU  Anesthesia Type:General  Level of Consciousness:  sedated, patient cooperative and responds to stimulation  Airway & Oxygen Therapy:Patient Spontanous Breathing and Patient connected to face mask oxgen  Post-op Assessment:  Report given to PACU RN and Post -op Vital signs reviewed and stable  Post vital signs:  Reviewed and stable  Last Vitals:  Filed Vitals:   05/28/15 1300  BP: 107/66  Pulse:   Temp: 36.2 C  Resp: 18    Complications: No apparent anesthesia complications

## 2015-05-28 NOTE — ED Notes (Signed)
Ultrasound at bedside

## 2015-05-28 NOTE — ED Notes (Signed)
Urology here to see pt

## 2015-05-28 NOTE — Anesthesia Preprocedure Evaluation (Addendum)
Anesthesia Evaluation  Patient identified by MRN, date of birth, ID band Patient awake    Reviewed: Allergy & Precautions, NPO status , Patient's Chart, lab work & pertinent test results  Airway Mallampati: II  TM Distance: >3 FB Neck ROM: Full    Dental  (+) Dental Advisory Given Four to five temporary crowns per patient. None loose. Dental advisory given.:   Pulmonary neg pulmonary ROS, asthma , Current Smoker,  breath sounds clear to auscultation  Pulmonary exam normal       Cardiovascular negative cardio ROS Normal cardiovascular examRhythm:Regular Rate:Normal     Neuro/Psych PSYCHIATRIC DISORDERS Depression negative neurological ROS  negative psych ROS   GI/Hepatic Neg liver ROS, GERD-  Medicated,  Endo/Other  Morbid obesity  Renal/GU Renal disease  negative genitourinary   Musculoskeletal negative musculoskeletal ROS (+)   Abdominal (+) + obese,   Peds negative pediatric ROS (+)  Hematology negative hematology ROS (+)   Anesthesia Other Findings   Reproductive/Obstetrics Negative pregnancy test.                         Anesthesia Physical Anesthesia Plan  ASA: III  Anesthesia Plan: General   Post-op Pain Management:    Induction: Intravenous  Airway Management Planned: Oral ETT  Additional Equipment:   Intra-op Plan:   Post-operative Plan: Extubation in OR  Informed Consent: I have reviewed the patients History and Physical, chart, labs and discussed the procedure including the risks, benefits and alternatives for the proposed anesthesia with the patient or authorized representative who has indicated his/her understanding and acceptance.   Dental advisory given  Plan Discussed with: CRNA  Anesthesia Plan Comments: (Continuous nausea. Plan OETT)       Anesthesia Quick Evaluation

## 2015-05-28 NOTE — H&P (Signed)
H&P  Chief Complaint: Right ureteral stone  History of Present Illness: Cassie Blake is a 24 y.o. year old who presented to the ED today with uncontrolled pain and nausea and vomiting due to her kidney stone.   She was diagnosed with a 3 mm kidney stone about 10 days ago and had seen Dr. Vernie Ammons in the office on 05/18/15.  She denies fever or hematuria.  Her pain has not been controlled in the ED this morning.  She continues on oral Vancomycin for C. Diff.   Past Medical History  Diagnosis Date  . Impetigo   . Polycystic ovary syndrome   . Asthma   . H/O drug abuse   . GERD (gastroesophageal reflux disease)   . Constipation, chronic   . Depression   . Clostridium difficile infection     being treated currently    Past Surgical History  Procedure Laterality Date  . Pilonidal cyst drainage      Home Medications:    Medication List    ASK your doctor about these medications        albuterol 108 (90 BASE) MCG/ACT inhaler  Commonly known as:  PROVENTIL HFA;VENTOLIN HFA  Inhale 1-2 puffs into the lungs every 6 (six) hours as needed for wheezing.     dicyclomine 20 MG tablet  Commonly known as:  BENTYL  Take 20 mg by mouth 3 (three) times daily before meals.     ketorolac 10 MG tablet  Commonly known as:  TORADOL  Take 10 mg by mouth every 8 (eight) hours as needed for moderate pain.     naproxen 500 MG tablet  Commonly known as:  NAPROSYN  Take 1 tablet (500 mg total) by mouth 2 (two) times daily.     naproxen sodium 220 MG tablet  Commonly known as:  ALEVE  Take 2 tablets every 12 hours until stone passes.     ondansetron 4 MG tablet  Commonly known as:  ZOFRAN  Take 1 tablet (4 mg total) by mouth every 6 (six) hours.     oxyCODONE-acetaminophen 5-325 MG per tablet  Commonly known as:  PERCOCET/ROXICET  Take 2 tablets by mouth every 6 (six) hours as needed for severe pain.     pantoprazole 40 MG tablet  Commonly known as:  PROTONIX  Take 40 mg by mouth daily.     polyethylene glycol packet  Commonly known as:  MIRALAX  Take 17 g by mouth daily.     RA PROBIOTIC COMPLEX Caps  Take 1 capsule by mouth daily.     silodosin 8 MG Caps capsule  Commonly known as:  RAPAFLO  Take 8 mg by mouth daily with breakfast.     solifenacin 10 MG tablet  Commonly known as:  VESICARE  Take 10 mg by mouth daily.     vancomycin 125 MG capsule  Commonly known as:  VANCOCIN  Take 125 mg by mouth 4 (four) times daily.        Allergies: No Known Allergies  No family history on file.  Social History:  reports that she has been smoking Cigarettes.  She does not have any smokeless tobacco history on file. She reports that she drinks alcohol. She reports that she uses illicit drugs (Marijuana).  ROS: A complete review of systems was performed.  All systems are negative except for pertinent findings as noted.  Physical Exam:  Vital signs in last 24 hours: Temp:  [97.2 F (36.2 C)-97.8 F (36.6 C)] 97.2  F (36.2 C) (08/13 0800) Pulse Rate:  [66-97] 68 (08/13 0800) Resp:  [16-20] 16 (08/13 0800) BP: (125-148)/(70-92) 125/78 mmHg (08/13 0800) SpO2:  [96 %-100 %] 97 % (08/13 0800) Weight:  [114.3 kg (251 lb 15.8 oz)] 114.3 kg (251 lb 15.8 oz) (08/13 0800) Constitutional:  Alert and oriented, No acute distress Cardiovascular: Regular rate and rhythm, No JVD Respiratory: Normal respiratory effort, Lungs clear bilaterally GI: Abdomen is soft, Right CVAT, nondistended, no abdominal masses Lymphatic: No lymphadenopathy Neurologic: Grossly intact, no focal deficits Psychiatric: Normal mood and affect  Laboratory Data:   Recent Labs  05/28/15 0127  HGB 13.9  HCT 41.0     Recent Labs  05/28/15 0127  NA 141  K 4.0  CL 106  GLUCOSE 101*  BUN 11  CREATININE 1.10*     Results for orders placed or performed during the hospital encounter of 05/28/15 (from the past 24 hour(s))  I-stat chem 8, ed     Status: Abnormal   Collection Time: 05/28/15   1:27 AM  Result Value Ref Range   Sodium 141 135 - 145 mmol/L   Potassium 4.0 3.5 - 5.1 mmol/L   Chloride 106 101 - 111 mmol/L   BUN 11 6 - 20 mg/dL   Creatinine, Ser 1.61 (H) 0.44 - 1.00 mg/dL   Glucose, Bld 096 (H) 65 - 99 mg/dL   Calcium, Ion 0.45 4.09 - 1.23 mmol/L   TCO2 21 0 - 100 mmol/L   Hemoglobin 13.9 12.0 - 15.0 g/dL   HCT 81.1 91.4 - 78.2 %  Urinalysis, Routine w reflex microscopic (not at [facility])     Status: Abnormal   Collection Time: 05/28/15  2:18 AM  Result Value Ref Range   Color, Urine YELLOW YELLOW   APPearance CLEAR CLEAR   Specific Gravity, Urine 1.018 1.005 - 1.030   pH 6.5 5.0 - 8.0   Glucose, UA NEGATIVE NEGATIVE mg/dL   Hgb urine dipstick NEGATIVE NEGATIVE   Bilirubin Urine NEGATIVE NEGATIVE   Ketones, ur NEGATIVE NEGATIVE mg/dL   Protein, ur NEGATIVE NEGATIVE mg/dL   Urobilinogen, UA 0.2 0.0 - 1.0 mg/dL   Nitrite NEGATIVE NEGATIVE   Leukocytes, UA TRACE (A) NEGATIVE  Urine microscopic-add on     Status: None   Collection Time: 05/28/15  2:18 AM  Result Value Ref Range   Squamous Epithelial / LPF RARE RARE   WBC, UA 3-6 <3 WBC/hpf   Bacteria, UA RARE RARE  POC urine preg, ED (not at [facility])     Status: None   Collection Time: 05/28/15  2:23 AM  Result Value Ref Range   Preg Test, Ur NEGATIVE NEGATIVE   No results found for this or any previous visit (from the past 240 hour(s)).  Renal Function:  Recent Labs  05/28/15 0127  CREATININE 1.10*   Estimated Creatinine Clearance: 102.1 mL/min (by C-G formula based on Cr of 1.1).  Radiologic Imaging: US Renal  05/28/2015   CLINICAL DATA:  Right-sided hydronephrosis.  EXAM: RENAL / URINARY TRACT ULTRASOUND COMPLETE  COMPARISON:  None.  FINDINGS: Right Kidney:  Length: 13.8 cm, within normal limits. Moderate right-sided hydronephrosis is again seen.  Left Kidney:  Length: 13.2 cm. Echogenicity within normal limits. No mass or hydronephrosis visualized.  Bladder:  Bilateral ureteral jets are visualized.  The bladder is unremarkable.  IMPRESSION: 1. Persistent moderate right-sided hydronephrosis. 2. Normal appearance of the left kidney.   Electronically Signed   By: Marin Roberts M.D.   On:  05/28/2015 02:28    Impression/Assessment:  3 mm distal right ureteral stone  Plan:  Her pain is not controlled and she requests surgical intervention for her stone.  We have reviewed options and she is agreeable to proceed with right ureteroscopic stone removal with possible laser lithotripsy and stent placement.  I discussed the potential benefits and risks of the procedure, side effects of the proposed treatment, the likelihood of the patient achieving the goals of the procedure, and any potential problems that might occur during the procedure or recuperation.   She will be admitted to the hospital for pain control with plans to proceed with her procedure later today when the OR is available.  Vaiden Adames,LES 05/28/2015, 11:59 AM  Moody Bruins MD

## 2015-05-28 NOTE — Anesthesia Procedure Notes (Signed)
Procedure Name: Intubation Date/Time: 05/28/2015 1:55 PM Performed by: Epimenio Sarin Pre-anesthesia Checklist: Patient identified, Emergency Drugs available, Suction available, Patient being monitored and Timeout performed Patient Re-evaluated:Patient Re-evaluated prior to inductionOxygen Delivery Method: Circle system utilized Preoxygenation: Pre-oxygenation with 100% oxygen Intubation Type: IV induction, Rapid sequence and Cricoid Pressure applied Laryngoscope Size: Mac and 3 Grade View: Grade I Tube type: Oral Tube size: 7.5 mm Number of attempts: 1 Airway Equipment and Method: Stylet Placement Confirmation: ETT inserted through vocal cords under direct vision,  positive ETCO2 and breath sounds checked- equal and bilateral Secured at: 21 cm Tube secured with: Tape Dental Injury: Teeth and Oropharynx as per pre-operative assessment

## 2015-05-28 NOTE — ED Notes (Signed)
Pt doesn't want to be discharged,  She wants more pain medication and her mother wants her admitted

## 2015-05-30 ENCOUNTER — Encounter (HOSPITAL_COMMUNITY): Payer: Self-pay | Admitting: Urology

## 2017-01-10 ENCOUNTER — Emergency Department (HOSPITAL_COMMUNITY)
Admission: EM | Admit: 2017-01-10 | Discharge: 2017-01-10 | Disposition: A | Discharge status: Home / Self Care | Payer: Self-pay | Attending: Emergency Medicine | Admitting: Emergency Medicine

## 2017-01-10 ENCOUNTER — Emergency Department (HOSPITAL_COMMUNITY): Payer: Self-pay

## 2017-01-10 ENCOUNTER — Encounter (HOSPITAL_COMMUNITY): Payer: Self-pay | Admitting: Emergency Medicine

## 2017-01-10 DIAGNOSIS — R339 Retention of urine, unspecified: Secondary | ICD-10-CM | POA: Insufficient documentation

## 2017-01-10 DIAGNOSIS — N390 Urinary tract infection, site not specified: Secondary | ICD-10-CM | POA: Insufficient documentation

## 2017-01-10 DIAGNOSIS — F17210 Nicotine dependence, cigarettes, uncomplicated: Secondary | ICD-10-CM | POA: Insufficient documentation

## 2017-01-10 DIAGNOSIS — J45909 Unspecified asthma, uncomplicated: Secondary | ICD-10-CM | POA: Insufficient documentation

## 2017-01-10 DIAGNOSIS — Z79899 Other long term (current) drug therapy: Secondary | ICD-10-CM | POA: Insufficient documentation

## 2017-01-10 DIAGNOSIS — R319 Hematuria, unspecified: Secondary | ICD-10-CM | POA: Insufficient documentation

## 2017-01-10 LAB — URINALYSIS, ROUTINE W REFLEX MICROSCOPIC
BILIRUBIN URINE: NEGATIVE
GLUCOSE, UA: NEGATIVE mg/dL
KETONES UR: NEGATIVE mg/dL
Nitrite: POSITIVE — AB
PH: 7 (ref 5.0–8.0)
Protein, ur: NEGATIVE mg/dL
Specific Gravity, Urine: 1.01 (ref 1.005–1.030)

## 2017-01-10 LAB — COMPREHENSIVE METABOLIC PANEL
ALK PHOS: 63 U/L (ref 38–126)
ALT: 43 U/L (ref 14–54)
ANION GAP: 8 (ref 5–15)
AST: 27 U/L (ref 15–41)
Albumin: 3.9 g/dL (ref 3.5–5.0)
BILIRUBIN TOTAL: 0.6 mg/dL (ref 0.3–1.2)
BUN: 12 mg/dL (ref 6–20)
CALCIUM: 9.1 mg/dL (ref 8.9–10.3)
CO2: 25 mmol/L (ref 22–32)
Chloride: 104 mmol/L (ref 101–111)
Creatinine, Ser: 0.69 mg/dL (ref 0.44–1.00)
GFR calc non Af Amer: >60 mL/min (ref >60)
Glucose, Bld: 82 mg/dL (ref 65–99)
Potassium: 3.8 mmol/L (ref 3.5–5.1)
SODIUM: 137 mmol/L (ref 135–145)
Total Protein: 7.3 g/dL (ref 6.5–8.1)

## 2017-01-10 LAB — CBC
HCT: 41.1 % (ref 36.0–46.0)
Hemoglobin: 14.3 g/dL (ref 12.0–15.0)
MCH: 29.5 pg (ref 26.0–34.0)
MCHC: 34.8 g/dL (ref 30.0–36.0)
MCV: 84.9 fL (ref 78.0–100.0)
PLATELETS: 236 10*3/uL (ref 150–400)
RBC: 4.84 MIL/uL (ref 3.87–5.11)
RDW: 12.1 % (ref 11.5–15.5)
WBC: 7.3 10*3/uL (ref 4.0–10.5)

## 2017-01-10 LAB — POC URINE PREG, ED: Preg Test, Ur: NEGATIVE

## 2017-01-10 LAB — LIPASE, BLOOD: Lipase: 26 U/L (ref 11–51)

## 2017-01-10 MED ORDER — CEPHALEXIN 500 MG PO CAPS: 500.0000 mg | ORAL_CAPSULE | Freq: Four times a day (QID) | ORAL | 0 refills | Status: DC

## 2017-01-10 MED ORDER — KETOROLAC TROMETHAMINE 15 MG/ML IJ SOLN
15.0000 mg | Freq: Once | INTRAMUSCULAR | Status: AC
  Administered 2017-01-10: 15 mg via INTRAVENOUS
  Filled 2017-01-10: qty 1

## 2017-01-10 MED ORDER — SODIUM CHLORIDE 0.9 % IV BOLUS (SEPSIS)
1000.0000 mL | Freq: Once | INTRAVENOUS | Status: AC
  Administered 2017-01-10: 1000 mL via INTRAVENOUS

## 2017-01-10 NOTE — ED Notes (Signed)
Cassie Shanda BumpsJessica PA aware of bladder scan results.  Awaiting orders regarding in/out cath or foley cath insertion since patient is having urinary retention.

## 2017-01-10 NOTE — ED Triage Notes (Signed)
Pt c/o bilateral flank pain, left worse than right, radiating to low abdomen and anuria onset 0500 today, nausea and emesis. Low abdominal pressure and sensation of need to urinate but cannot. No dysuria or hematuria. Hx of obstructive renal calculi with need for lithotripsy.

## 2017-01-10 NOTE — ED Provider Notes (Signed)
Pt is comfortable going home with foley.  Her Mother is an Charity fundraiserN.   I advised remove in 3 days.   Pt does not want to start antibiotics due to history of C diff.   Rx given for keflex but pt will await culture.    Lonia SkinnerLeslie K GnadenhuttenSofia, PA-C 01/10/17 1708    Lavera Guiseana Duo Liu, MD 01/11/17 1249

## 2017-01-10 NOTE — ED Provider Notes (Signed)
BLADDER CATHETERIZATION Date/Time: 01/10/2017 1:18 PM Performed by: Azalia BilisAMPOS, Maely Clements Authorized by: Azalia BilisAMPOS, Samarrah Tranchina   Consent:    Consent obtained:  Verbal   Consent given by:  Patient Pre-procedure details:    Procedure purpose:  Therapeutic   Preparation: Patient was prepped and draped in usual sterile fashion   Anesthesia (see MAR for exact dosages):    Anesthesia method:  None Procedure details:    Provider performed due to:  Complicated insertion and nurse unable to complete   Catheter insertion:  Indwelling   Catheter type:  Foley   Catheter size:  14 Fr   Number of attempts:  2   Urine characteristics:  Yellow Post-procedure details:    Patient tolerance of procedure:  Tolerated well, no immediate complications     Azalia BilisKevin Hal Norrington, MD 01/10/17 1322

## 2017-01-10 NOTE — ED Provider Notes (Signed)
[facility]-EMERGENCY DEPT Provider Note   CSN: [id number] Arrival date & time: 01/10/17  1113     History   Chief Complaint Chief Complaint  Patient presents with  . Flank Pain    HPI Cassie Blake is a 26 y.o. female presenting with bilateral flanks pain more so on the left side. She reports an achy pain at baseline with sporadic shooting sharp pains. She has tried tylenol and ibuprofen OTC without relief. She reports not being able to urinate since this morning, having an urge but not voiding. She reports the lower abdominal discomfort as a pressure rather than a pain. She also reports nausea/vomiting this morning. She urinated with normal voiding last night. She denies hematuria, dysuria, fever, chills, diarrhea, blood in her stool, or other symptoms. She has a hx of multiple kidney stones requiring intervention. Denies abdominal surgeries.  HPI  Past Medical History:  Diagnosis Date  . Asthma   . Clostridium difficile infection    being treated currently  . Constipation, chronic   . Depression   . GERD (gastroesophageal reflux disease)   . H/O drug abuse   . Impetigo   . Polycystic ovary syndrome     Patient Active Problem List   Diagnosis Date Noted  . Ureteral stone with hydronephrosis 05/28/2015    Past Surgical History:  Procedure Laterality Date  . CYSTOSCOPY/URETEROSCOPY/HOLMIUM LASER/STENT PLACEMENT Right 05/28/2015   Procedure: CYSTOSCOPY/RIGHT RETROGRADE PYELOGRAM/ RIGHT URETEROSCOPY/STONE BASKETRY/HOLMIUM LASER APPLICATION;  Surgeon: Heloise PurpuraLester Borden, MD;  Location: [facility] ORS;  Service: Urology;  Laterality: Right;  . PILONIDAL CYST DRAINAGE      OB History    No data available       Home Medications    Prior to Admission medications   Medication Sig Start Date End Date Taking? Authorizing Provider  albuterol (PROVENTIL HFA;VENTOLIN HFA) 108 (90 BASE) MCG/ACT inhaler Inhale 1-2 puffs into the lungs every 6 (six) hours as needed for wheezing. 05/28/15 01/10/17 Yes  Heloise PurpuraLester Borden, MD  ibuprofen (ADVIL,MOTRIN) 200 MG tablet Take 400-800 mg by mouth every 4 (four) hours as needed for fever, headache, mild pain, moderate pain or cramping.   Yes Historical Provider, MD    Family History No family history on file.  Social History Social History  Substance Use Topics  . Smoking status: Current Every Day Smoker    Types: Cigarettes  . Smokeless tobacco: Not on file  . Alcohol use Yes     Comment: socially     Allergies   Patient has no known allergies.   Review of Systems Review of Systems  Constitutional: Negative for chills and fever.  HENT: Negative for ear pain and sore throat.   Eyes: Negative for visual disturbance.  Respiratory: Negative for cough, chest tightness, shortness of breath, wheezing and stridor.   Cardiovascular: Negative for chest pain, palpitations and leg swelling.  Gastrointestinal: Negative for abdominal distention, abdominal pain, blood in stool, nausea and vomiting.  Genitourinary: Positive for decreased urine volume, difficulty urinating and flank pain. Negative for dysuria, genital sores, hematuria and pelvic pain.  Musculoskeletal: Positive for back pain. Negative for gait problem, myalgias, neck pain and neck stiffness.  Skin: Negative for color change, pallor and rash.  Neurological: Negative for dizziness, weakness, light-headedness and headaches.  Psychiatric/Behavioral: Negative for behavioral problems.     Physical Exam Updated Vital Signs BP 111/61 (BP Location: Left Arm)   Pulse 75   Temp 97.8 F (36.6 C) (Oral)   Resp 18   SpO2 94%  Physical Exam  Constitutional: She appears well-developed and well-nourished. No distress.  Afebrile, non toxic appearing lying comfortably in bed in no acute distress. Declined pain medications at this time.   HENT:  Head: Normocephalic and atraumatic.  Eyes: Conjunctivae are normal.  Neck: Neck supple.  Cardiovascular: Normal rate, regular rhythm, normal heart  sounds and intact distal pulses.   No murmur heard. Pulmonary/Chest: Effort normal and breath sounds normal. No respiratory distress. She has no wheezes. She has no rales. She exhibits no tenderness.  Abdominal: Soft. Bowel sounds are normal. She exhibits no distension and no mass. There is no tenderness. There is no rebound and no guarding.  No CVA tenderness  Musculoskeletal: Normal range of motion. She exhibits no edema, tenderness or deformity.  Neurological: She is alert.  Skin: Skin is warm and dry. No rash noted. She is not diaphoretic. No erythema. No pallor.  Psychiatric: She has a normal mood and affect.  Nursing note and vitals reviewed.    ED Treatments / Results  Labs (all labs ordered are listed, but only abnormal results are displayed) Labs Reviewed  COMPREHENSIVE METABOLIC PANEL  LIPASE, BLOOD  CBC  URINALYSIS, ROUTINE W REFLEX MICROSCOPIC  POC URINE PREG, ED    EKG  EKG Interpretation None       Radiology Ct Renal Stone Study  Result Date: 01/10/2017 CLINICAL DATA:  Left flank and left lower quadrant pain with nausea and vomiting EXAM: CT ABDOMEN AND PELVIS WITHOUT CONTRAST TECHNIQUE: Multidetector CT imaging of the abdomen and pelvis was performed following the standard protocol without oral or intravenous contrast material administration. COMPARISON:  May 18, 2015. FINDINGS: Lower chest: There is lower lobe patchy atelectasis bilaterally. Lung bases otherwise are clear. Hepatobiliary: No focal liver lesions are evident on this noncontrast enhanced study. Gallbladder wall is not appreciably thickened. There is no biliary duct dilatation. Pancreas: No pancreatic mass or inflammatory focus. Spleen: No splenic lesions are evident. Adrenals/Urinary Tract: Adrenals appear normal bilaterally. There is no renal mass or hydronephrosis on either side. There is a junctional parenchymal defect in each kidney, an anatomic variant. There is a 1 mm nonobstructing calculus in  the lower pole the left kidney. No other intrarenal calculi are identified. There is no ureteral calculus on either side. Urinary bladder is decompressed with a Foley catheter. Minimal air within the urinary bladder is likely due to recent instrumentation. Stomach/Bowel: There are occasional sigmoid diverticula without diverticulitis. There is no bowel wall or mesenteric thickening. There is no appreciable bowel obstruction. No free air or portal venous air. Vascular/Lymphatic: There is no abdominal aortic aneurysm. No vascular lesions are evident. There is no appreciable adenopathy in the abdomen or pelvis. Reproductive: Uterus is anteverted. No pelvic mass or pelvic fluid collection. Other: Appendix appears normal. No ascites or abscess evident in the abdomen or pelvis. There is a minimal ventral hernia containing only fat. Musculoskeletal: There are no blastic or lytic bone lesions. There is no intramuscular or abdominal wall lesion. IMPRESSION: 1 mm nonobstructing focus lower pole left kidney. No hydronephrosis on either side. No ureteral calculi. Occasional sigmoid diverticula without diverticulitis. No bowel obstruction or bowel wall thickening. No abscess. Appendix appears normal. There is a minimal ventral hernia containing only fat. Electronically Signed   By: Bretta Bang III M.D.   On: 01/10/2017 14:32    Procedures Procedures (including critical care time)  Medications Ordered in ED Medications  sodium chloride 0.9 % bolus 1,000 mL (1,000 mLs Intravenous New Bag/Given 01/10/17 1216)  ketorolac (TORADOL) 15 MG/ML injection 15 mg (15 mg Intravenous Given 01/10/17 1354)     Initial Impression / Assessment and Plan / ED Course  I have reviewed the triage vital signs and the nursing notes.  Pertinent labs & imaging results that were available during my care of the patient were reviewed by me and considered in my medical decision making (see chart for details).      Patient with hx of  IBS non-compliant with medications and multiple kidney stones presenting with left flank pain and difficulty urinating.  labs unremarkable, vitals stable.  CT renal negative for obstructing stone or hydronephrosis or diverticulitis/ acute process.  Found to have urinary retention on bladder scan showing > . Foley cath was inserted and urine sent to lab.  On reassessment she had improved after bladder decompression, fluids and toradol. Discussed CT results and need to keep foley for a few days with follow up with urology. Patient was agreeable to discharge plan awaiting urine results.   Transferred patient care at end of shift to [facility] pending urine analysis. Anticipate discharge home with foley and close follow up with urology +/- treatment for UTI and urine culture. Treat as appropriate if any new findings.   Final Clinical Impressions(s) / ED Diagnoses   Final diagnoses:  None    New Prescriptions New Prescriptions   No medications on file     Gregary Cromer 01/10/17 1845    Azalia Bilis, MD 01/11/17 [phone]

## 2017-01-12 LAB — URINE CULTURE: Culture: >=100000 — AB

## 2017-01-13 ENCOUNTER — Telehealth: Payer: Self-pay

## 2017-01-13 NOTE — Telephone Encounter (Signed)
Post ED Visit - Positive Culture Follow-up  Culture report reviewed by antimicrobial stewardship pharmacist:   Enzo Bi, Pharm.D.  Celedonio Miyamoto, Pharm.D., BCPS AQ-ID  Garvin Fila, Pharm.D., BCPS  Georgina Pillion, Pharm.D., BCPS  [place], [address].D., BCPS, AAHIVP  Estella Husk, Pharm.D., BCPS, AAHIVP  Lysle Pearl, PharmD, BCPS  Casilda Carls, PharmD, BCPS  Pollyann Samples, PharmD, BCPS [facility] D Positive urine culture Treated with Cephalexin, organism sensitive to the same and no further patient follow-up is required at this time.  Jerry Caras 01/13/2017, 11:29 AM

## 2017-03-06 ENCOUNTER — Encounter: Payer: Self-pay | Admitting: Physician Assistant

## 2017-03-06 ENCOUNTER — Ambulatory Visit (INDEPENDENT_AMBULATORY_CARE_PROVIDER_SITE_OTHER): Payer: BC Managed Care – PPO | Admitting: Physician Assistant

## 2017-03-06 VITALS — BP 123/79 | HR 95 | Temp 98.5°F | Resp 17 | Ht 66.5 in | Wt 273.0 lb

## 2017-03-06 DIAGNOSIS — R21 Rash and other nonspecific skin eruption: Secondary | ICD-10-CM

## 2017-03-06 DIAGNOSIS — R0981 Nasal congestion: Secondary | ICD-10-CM | POA: Diagnosis not present

## 2017-03-06 DIAGNOSIS — B029 Zoster without complications: Secondary | ICD-10-CM

## 2017-03-06 DIAGNOSIS — R631 Polydipsia: Secondary | ICD-10-CM | POA: Diagnosis not present

## 2017-03-06 MED ORDER — VALACYCLOVIR HCL 1 G PO TABS: 1000.0000 mg | ORAL_TABLET | Freq: Three times a day (TID) | ORAL | 0 refills | Status: AC

## 2017-03-06 MED ORDER — MUCINEX DM MAXIMUM STRENGTH 60-1200 MG PO TB12: 1.0000 | ORAL_TABLET | Freq: Two times a day (BID) | ORAL | 1 refills | Status: AC

## 2017-03-06 MED ORDER — LIDOCAINE 5 % EX PTCH: 1.0000 | MEDICATED_PATCH | CUTANEOUS | 0 refills | Status: DC

## 2017-03-06 NOTE — Patient Instructions (Addendum)
You can take 600 mg Ibuprofen and/or 500 mg Tylenol for pain.  Please come back if you are not better in 1 week.   Thank you for coming in today. I hope you feel we met your needs.  Feel free to call [facility] if you have any questions or further requests.  Please consider signing up for MyChart if you do not already have it, as this is a great way to communicate with me.  Best,  Whitney McVey, PA-C   Shingles Shingles, which is also known as herpes zoster, is an infection that causes a painful skin rash and fluid-filled blisters. Shingles is not related to genital herpes, which is a sexually transmitted infection. Shingles only develops in people who:  Have had chickenpox.  Have received the chickenpox vaccine. (This is rare.) What are the causes? Shingles is caused by varicella-zoster virus (VZV). This is the same virus that causes chickenpox. After exposure to VZV, the virus stays in the body in an inactive (dormant) state. Shingles develops if the virus reactivates. This can happen many years after the initial exposure to VZV. It is not known what causes this virus to reactivate. What increases the risk? People who have had chickenpox or received the chickenpox vaccine are at risk for shingles. Infection is more common in people who:  Are older than age 51.  Have a weakened defense (immune) system, such as those with HIV, AIDS, or cancer.  Are taking medicines that weaken the immune system, such as transplant medicines.  Are under great stress. What are the signs or symptoms? Early symptoms of this condition include itching, tingling, and pain in an area on your skin. Pain may be described as burning, stabbing, or throbbing. A few days or weeks after symptoms start, a painful red rash appears, usually on one side of the body in a bandlike or beltlike pattern. The rash eventually turns into fluid-filled blisters that break open, scab over, and dry up in about 2-3 weeks. At any time  during the infection, you may also develop:  A fever.  Chills.  A headache.  An upset stomach. How is this diagnosed? This condition is diagnosed with a skin exam. Sometimes, skin or fluid samples are taken from the blisters before a diagnosis is made. These samples are examined under a microscope or sent to a lab for testing. How is this treated? There is no specific cure for this condition. Your health care provider will probably prescribe medicines to help you manage pain, recover more quickly, and avoid long-term problems. Medicines may include:  Antiviral drugs.  Anti-inflammatory drugs.  Pain medicines. If the area involved is on your face, you may be referred to a specialist, such as an eye doctor (ophthalmologist) or an ear, nose, and throat (ENT) doctor to help you avoid eye problems, chronic pain, or disability. Follow these instructions at home: Medicines   Take medicines only as directed by your health care provider.  Apply an anti-itch or numbing cream to the affected area as directed by your health care provider. Blister and Rash Care   Take a cool bath or apply cool compresses to the area of the rash or blisters as directed by your health care provider. This may help with pain and itching.  Keep your rash covered with a loose bandage (dressing). Wear loose-fitting clothing to help ease the pain of material rubbing against the rash.  Keep your rash and blisters clean with mild soap and cool water or as directed  by your health care provider.  Check your rash every day for signs of infection. These include redness, swelling, and pain that lasts or increases.  Do not pick your blisters.  Do not scratch your rash. General instructions   Rest as directed by your health care provider.  Keep all follow-up visits as directed by your health care provider. This is important.  Until your blisters scab over, your infection can cause chickenpox in people who have never  had it or been vaccinated against it. To prevent this from happening, avoid contact with other people, especially:  Babies.  Pregnant women.  Children who have eczema.  Elderly people who have transplants.  People who have chronic illnesses, such as leukemia or AIDS. Contact a health care provider if:  Your pain is not relieved with prescribed medicines.  Your pain does not get better after the rash heals.  Your rash looks infected. Signs of infection include redness, swelling, and pain that lasts or increases. Get help right away if:  The rash is on your face or nose.  You have facial pain, pain around your eye area, or loss of feeling on one side of your face.  You have ear pain or you have ringing in your ear.  You have loss of taste.  Your condition gets worse. This information is not intended to replace advice given to you by your health care provider. Make sure you discuss any questions you have with your health care provider. Document Released: 10/01/2005 Document Revised: 05/27/2016 Document Reviewed: 08/12/2014 Elsevier Interactive Patient Education  2017 [employer].   IF you received an x-ray today, you will receive an invoice from [facility]. Please contact [facility] at [phone] with questions or concerns regarding your invoice.   IF you received labwork today, you will receive an invoice from [place]. Please contact [facility] at [phone] with questions or concerns regarding your invoice.   Our billing staff will not be able to assist you with questions regarding bills from these companies.  You will be contacted with the lab results as soon as they are available. The fastest way to get your results is to activate your My Chart account. Instructions are located on the last page of this paperwork. If you have not heard from [place] regarding the results in 2 weeks, please contact this office.

## 2017-03-06 NOTE — Progress Notes (Signed)
Cassie Blake  MRN: [id number] DOB: 1991-01-13  PCP: Patient, No Pcp Per  Subjective:  Pt is a 26 year old female presents to clinic for rash on her left hand x 3 days. It started as a red area on the top of her hand. Yesterday it developed a few blisters. She describes it as "burning" and very painful.  Denies itching or drainage. She has put neosporin on it - not helping.  She has had chickenpox in the past.    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 Tested for HIV "a lot time ago".   She admits increased thirst - drinks about 5 liters of water a day. Wakes up once a night to urinate. Admits to standing up and feeling dizzy.  FHx diabetes - father, brother, cousin  C/o recent cold. Cannot get rid of congestion. Denies fever, chills, shob, cp, wheezing, n/v/d.   Review of Systems  HENT: Positive for congestion.   Respiratory: Negative for cough and wheezing.   Cardiovascular: Negative for chest pain and palpitations.  Gastrointestinal: Negative for abdominal pain, diarrhea, nausea and vomiting.  Endocrine: Positive for polydipsia.  Genitourinary: Negative for decreased urine volume, difficulty urinating, dysuria, enuresis, flank pain, frequency, hematuria and urgency.  Skin: Positive for rash.  Neurological: Positive for dizziness and light-headedness.    Patient Active Problem List   Diagnosis Date Noted  . Ureteral stone with hydronephrosis 05/28/2015    Current Outpatient Prescriptions on File Prior to Visit  Medication Sig Dispense Refill  . ibuprofen (ADVIL,MOTRIN) 200 MG tablet Take 400-800 mg by mouth every 4 (four) hours as needed for fever, headache, mild pain, moderate pain or cramping.    Marland Kitchen albuterol (PROVENTIL HFA;VENTOLIN HFA) 108 (90 BASE) MCG/ACT inhaler Inhale 1-2 puffs into the lungs every 6 (six) hours as needed for wheezing. 1 Inhaler 0  . cephALEXin (KEFLEX) 500 MG capsule Take 1 capsule (500 mg total) by mouth 4 (four) times daily.  (Patient not taking: Reported on 03/06/2017) 40 capsule 0   No current facility-administered medications on file prior to visit.     No Known Allergies   Objective:  BP 123/79   Pulse 95   Temp 98.5 F (36.9 C) (Oral)   Resp 17   Ht 5' 6.5" (1.689 m)   Wt 273 lb (123.8 kg)   SpO2 98%   BMI 43.40 kg/m   Physical Exam  Constitutional: She is oriented to person, place, and time and well-developed, well-nourished, and in no distress. No distress.  Cardiovascular: Normal rate, regular rhythm and normal heart sounds.   Neurological: She is alert and oriented to person, place, and time. GCS score is 15.  Skin: Skin is warm and dry. Rash noted.  3.5 x 2 cm erythematous base of posterior palm with grouped vesicles. No drainage.   Psychiatric: Mood, memory, affect and judgment normal.  Vitals reviewed.   Assessment and Plan :  1. Herpes zoster without complication 2. Rash and nonspecific skin eruption 3. Increased thirst  - valACYclovir (VALTREX) 1000 MG tablet; Take 1 tablet (1,000 mg total) by mouth 3 (three) times daily.  Dispense: 21 tablet; Refill: 0 - lidocaine (LIDODERM) 5 %; Place 1 patch onto the skin daily. Remove & Discard patch within 12 hours or as directed by MD  Dispense: 30 patch; Refill: 0 - Hemoglobin A1c - CMP14+EGFR - HIV antibody - Labs are pending. Will contact with results.   4. Nasal congestion - Dextromethorphan-Guaifenesin [facility]  DM MAXIMUM STRENGTH) 60-1200 MG TB12; Take 1 tablet by mouth every 12 (twelve) hours.  Dispense: 14 each; Refill: 1   Whitney Keren Alverio, PA-C  [facility] [place] 03/06/2017 9:19 AM

## 2017-03-07 LAB — HIV ANTIBODY (ROUTINE TESTING W REFLEX): HIV Screen 4th Generation wRfx: NONREACTIVE

## 2017-03-07 LAB — CMP14+EGFR
ALT: 41 IU/L — ABNORMAL HIGH (ref 0–32)
AST: 22 IU/L (ref 0–40)
Albumin/Globulin Ratio: 1.9 (ref 1.2–2.2)
Albumin: 4.5 g/dL (ref 3.5–5.5)
Alkaline Phosphatase: 71 IU/L (ref 39–117)
BUN/Creatinine Ratio: 16 (ref 9–23)
BUN: 11 mg/dL (ref 6–20)
Bilirubin Total: 0.3 mg/dL (ref 0.0–1.2)
CO2: 23 mmol/L (ref 18–29)
Calcium: 9.5 mg/dL (ref 8.7–10.2)
Chloride: 102 mmol/L (ref 96–106)
Creatinine, Ser: 0.68 mg/dL (ref 0.57–1.00)
GFR calc Af Amer: 142 mL/min/{1.73_m2} (ref >59)
GFR calc non Af Amer: 123 mL/min/{1.73_m2} (ref >59)
Globulin, Total: 2.4 g/dL (ref 1.5–4.5)
Glucose: 84 mg/dL (ref 65–99)
Potassium: 4.6 mmol/L (ref 3.5–5.2)
Sodium: 140 mmol/L (ref 134–144)
Total Protein: 6.9 g/dL (ref 6.0–8.5)

## 2017-03-07 LAB — HEMOGLOBIN A1C
Est. average glucose Bld gHb Est-mCnc: 111 mg/dL
Hgb A1c MFr Bld: 5.5 % (ref 4.8–5.6)

## 2017-03-08 ENCOUNTER — Telehealth: Payer: Self-pay | Admitting: Physician Assistant

## 2017-03-08 NOTE — Telephone Encounter (Signed)
PATIENT SAW WHITNEY MCVEY ON WED. (03/06/17) AND SHE IS CALLING TO GET HER LAB RESULTS. BEST PHONE [phone] (CELL) PHARMACY CHOICE IS [facility] ON [address] [place]

## 2017-03-11 NOTE — Telephone Encounter (Signed)
Please see results and advise.

## 2017-03-12 NOTE — Progress Notes (Signed)
Please call pt and let her know she is negative for HIV. Her hemoglobin A1C (test for diabetes) is on the high end of normal. Kidneys and liver look fine.  Please come back if rash has not cleared.  Thank you!

## 2017-03-13 ENCOUNTER — Encounter: Payer: Self-pay | Admitting: Physician Assistant

## 2017-03-13 ENCOUNTER — Telehealth: Payer: Self-pay

## 2017-03-13 NOTE — Telephone Encounter (Signed)
Pa lido patches   Your information has been submitted to [employer]. To check for an updated outcome later, reopen this PA request from your dashboard. If [employer] has not responded to your request within 24 hours, contact [employer] at [phone]. If you think there may be a problem with your PA request, use our live chat feature at the bottom right.

## 2017-03-15 NOTE — Telephone Encounter (Signed)
Called pt, left VM asking if she still needs patches. Will appeal if needed. Thank you!

## 2017-03-15 NOTE — Telephone Encounter (Signed)
Lido patch denied, only approved for herpes pain, cancer pain or diabetic pain  To appeal call [phone]

## 2017-09-19 ENCOUNTER — Other Ambulatory Visit: Payer: Self-pay | Admitting: Orthopedic Surgery

## 2017-09-19 DIAGNOSIS — M5416 Radiculopathy, lumbar region: Secondary | ICD-10-CM

## 2017-09-30 ENCOUNTER — Other Ambulatory Visit: Payer: BLUE CROSS/BLUE SHIELD

## 2020-09-05 ENCOUNTER — Other Ambulatory Visit: Payer: Self-pay

## 2020-09-05 ENCOUNTER — Encounter (HOSPITAL_BASED_OUTPATIENT_CLINIC_OR_DEPARTMENT_OTHER): Payer: Self-pay

## 2020-09-05 ENCOUNTER — Emergency Department (HOSPITAL_BASED_OUTPATIENT_CLINIC_OR_DEPARTMENT_OTHER)
Admission: EM | Admit: 2020-09-05 | Discharge: 2020-09-05 | Disposition: A | Discharge status: Home / Self Care | Payer: BLUE CROSS/BLUE SHIELD | Attending: Emergency Medicine | Admitting: Emergency Medicine

## 2020-09-05 DIAGNOSIS — K047 Periapical abscess without sinus: Secondary | ICD-10-CM

## 2020-09-05 DIAGNOSIS — R202 Paresthesia of skin: Secondary | ICD-10-CM | POA: Insufficient documentation

## 2020-09-05 DIAGNOSIS — F17210 Nicotine dependence, cigarettes, uncomplicated: Secondary | ICD-10-CM | POA: Insufficient documentation

## 2020-09-05 DIAGNOSIS — J45909 Unspecified asthma, uncomplicated: Secondary | ICD-10-CM | POA: Insufficient documentation

## 2020-09-05 DIAGNOSIS — M79672 Pain in left foot: Secondary | ICD-10-CM

## 2020-09-05 DIAGNOSIS — M549 Dorsalgia, unspecified: Secondary | ICD-10-CM | POA: Insufficient documentation

## 2020-09-05 MED ORDER — AMOXICILLIN 500 MG PO CAPS: 500.0000 mg | ORAL_CAPSULE | Freq: Three times a day (TID) | ORAL | 0 refills | Status: DC

## 2020-09-05 MED ORDER — ALBUTEROL SULFATE HFA 108 (90 BASE) MCG/ACT IN AERS
2.0000 | INHALATION_SPRAY | RESPIRATORY_TRACT | Status: DC | PRN
  Administered 2020-09-05: 2 via RESPIRATORY_TRACT
  Filled 2020-09-05: qty 6.7

## 2020-09-05 MED ORDER — PREDNISONE 20 MG PO TABS: 40.0000 mg | ORAL_TABLET | Freq: Every day | ORAL | 0 refills | Status: DC

## 2020-09-05 NOTE — ED Triage Notes (Signed)
Pt arrives with pain to left heel denies injury also c/o pain to left back lower tooth.

## 2020-09-05 NOTE — Discharge Instructions (Signed)
Follow-up with a dentist for the tooth.  Follow-up with sports medicine as needed for the foot pain.

## 2020-09-05 NOTE — ED Provider Notes (Signed)
[facility] EMERGENCY DEPARTMENT Provider Note   CSN: [id number] Arrival date & time: 09/05/20  0841     History Chief Complaint  Patient presents with  . Foot Pain  . Dental Pain    Cassie Blake is a 29 y.o. female.  HPI Patient presents with left heel pain and pain in left tooth.  Has had for last few days.  States the foot pain gets severe.  Worse with walking on it.  No known trauma.  Does not go up the leg.  Worse on the sole of her foot.  Also gets numbness in the toes at times.  Also has back pain.  States she thought the numbness could be due to her sciatica.  No loss of bladder or bowel control.  No fevers. Also complaining of pain in her left lower posterior teeth.  States she thinks she has an abscess.  States she has had a crown there and thinks it is cracked.  States she thinks food could be stuck in there.  No difficulty swallowing. Also mild shortness of breath at times.  States she is out of her inhaler.  No fevers.  No coughing.    Past Medical History:  Diagnosis Date  . Allergy   . Anxiety   . Asthma   . Clostridium difficile infection    being treated currently  . Constipation, chronic   . Depression   . GERD (gastroesophageal reflux disease)   . H/O drug abuse (HCC)   . Impetigo   . Polycystic ovary syndrome     Patient Active Problem List   Diagnosis Date Noted  . Ureteral stone with hydronephrosis 05/28/2015    Past Surgical History:  Procedure Laterality Date  . CYSTOSCOPY/URETEROSCOPY/HOLMIUM LASER/STENT PLACEMENT Right 05/28/2015   Procedure: CYSTOSCOPY/RIGHT RETROGRADE PYELOGRAM/ RIGHT URETEROSCOPY/STONE BASKETRY/HOLMIUM LASER APPLICATION;  Surgeon: Heloise Purpura, MD;  Location: [facility];  Service: Urology;  Laterality: Right;  . PILONIDAL CYST DRAINAGE       OB History   No obstetric history on file.     No family history on file.  Social History   Tobacco Use  . Smoking status: Current Every Day Smoker    Packs/day: 0.25     Years: 11.00    Pack years: 2.75    Types: Cigarettes  . Smokeless tobacco: Never Used  Substance Use Topics  . Alcohol use: Yes    Alcohol/week: 2.0 standard drinks    Types: 2 Glasses of wine per week    Comment: socially  . 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    Home Medications Prior to Admission medications   Medication Sig Start Date End Date Taking? Authorizing Provider  albuterol (PROVENTIL HFA;VENTOLIN HFA) 108 (90 BASE) MCG/ACT inhaler Inhale 1-2 puffs into the lungs every 6 (six) hours as needed for wheezing. 05/28/15 01/10/17  Heloise Purpura, MD  amoxicillin (AMOXIL) 500 MG capsule Take 1 capsule (500 mg total) by mouth 3 (three) times daily. 09/05/20   Benjiman Core, MD  Dextromethorphan-Guaifenesin (MUCINEX DM MAXIMUM STRENGTH) 60-1200 MG TB12 Take 1 tablet by mouth every 12 (twelve) hours. 03/06/17   McVey, Madelaine Bhat, PA-C  ibuprofen (ADVIL,MOTRIN) 200 MG tablet Take 400-800 mg by mouth every 4 (four) hours as needed for fever, headache, mild pain, moderate pain or cramping.    [provider]  lidocaine (LIDODERM) 5 % Place 1 patch onto the skin daily. Remove & Discard patch within  12 hours or as directed by MD 03/06/17   McVey, Madelaine Bhat, PA-C  predniSONE (DELTASONE) 20 MG tablet Take 2 tablets (40 mg total) by mouth daily. 09/05/20   Benjiman Core, MD    Allergies    Patient has no known allergies.  Review of Systems   Review of Systems  Constitutional: Negative for appetite change.  HENT: Positive for dental problem.   Respiratory: Positive for shortness of breath.   Cardiovascular: Negative for chest pain.  Gastrointestinal: Negative for abdominal pain.  Endocrine: Negative for polyuria.  Genitourinary: Negative for flank pain.  Musculoskeletal: Positive for back pain.       Left heel pain.  Tingling in toes.  Skin: Negative for rash.  Neurological: Negative for weakness  and numbness.  Psychiatric/Behavioral: Negative for confusion.    Physical Exam Updated Vital Signs BP 116/82 (BP Location: Right Arm)   Pulse 87   Temp 98.2 F (36.8 C) (Oral)   Resp 18   Ht 5\' 6"  (1.676 m)   Wt (!) 137.8 kg   SpO2 97%   BMI 49.02 kg/m   Physical Exam Vitals and nursing note reviewed.  HENT:     Head: Normocephalic.     Mouth/Throat:     Comments: Tenderness to the jaw lateral to left lower to most posterior teeth.  No fluctuance.  No drainage. Eyes:     Pupils: Pupils are equal, round, and reactive to light.  Cardiovascular:     Rate and Rhythm: Normal rate.  Pulmonary:     Breath sounds: No wheezing or rhonchi.  Abdominal:     Tenderness: There is no abdominal tenderness.  Musculoskeletal:        General: Tenderness present.     Cervical back: Neck supple.     Comments: Tenderness anterior to left heel on plantar aspect of foot.  No skin changes.  No tenderness directly over calcaneus.  No tenderness over mid or forefoot.  Mild lumbar tenderness.  Skin:    General: Skin is warm.     Capillary Refill: Capillary refill takes less than 2 seconds.  Neurological:     Mental Status: She is alert and oriented to person, place, and time.  Psychiatric:        Mood and Affect: Mood normal.     ED Results / Procedures / Treatments   Labs (all labs ordered are listed, but only abnormal results are displayed) Labs Reviewed - No data to display  EKG None  Radiology No results found.  Procedures Procedures (including critical care time)  Medications Ordered in ED Medications  albuterol (VENTOLIN HFA) 108 (90 Base) MCG/ACT inhaler 2 puff (2 puffs Inhalation Given 09/05/20 0946)    ED Course  I have reviewed the triage vital signs and the nursing notes.  Pertinent labs & imaging results that were available during my care of the patient were reviewed by me and considered in my medical decision making (see chart for details).    MDM  Rules/Calculators/A&P                          Patient presents with dental pain.  Will treat with antibiotics.  Also foot pain.  May be a plantar fasciitis or else radiation from her back pain.  Will treat with short course of steroids even with potential dental infection.  Other cause of the foot pain such as radiation from a epidural abscess felt less likely.  States she  does have sciatica. Patient also requested refill on her albuterol.  Does not have an insurance.  Albuterol given here.  Discharge home with outpatient follow-up Final Clinical Impression(s) / ED Diagnoses Final diagnoses:  Foot pain, left  Dental infection    Rx / DC Orders ED Discharge Orders         Ordered    predniSONE (DELTASONE) 20 MG tablet  Daily        09/05/20 1010    amoxicillin (AMOXIL) 500 MG capsule  3 times daily        09/05/20 1010           Benjiman Core, MD 09/05/20 1037

## 2020-09-05 NOTE — ED Notes (Signed)
Patient given MDI to take home. Does not wish to take it now, and has no questions. No distress noted, SAT 98%

## 2021-04-30 ENCOUNTER — Emergency Department (HOSPITAL_BASED_OUTPATIENT_CLINIC_OR_DEPARTMENT_OTHER)
Admission: EM | Admit: 2021-04-30 | Discharge: 2021-04-30 | Disposition: A | Discharge status: Home / Self Care | Payer: BC Managed Care – PPO | Attending: Emergency Medicine | Admitting: Emergency Medicine

## 2021-04-30 ENCOUNTER — Emergency Department (HOSPITAL_BASED_OUTPATIENT_CLINIC_OR_DEPARTMENT_OTHER): Payer: BC Managed Care – PPO

## 2021-04-30 ENCOUNTER — Encounter (HOSPITAL_BASED_OUTPATIENT_CLINIC_OR_DEPARTMENT_OTHER): Payer: Self-pay | Admitting: Emergency Medicine

## 2021-04-30 ENCOUNTER — Other Ambulatory Visit: Payer: Self-pay

## 2021-04-30 DIAGNOSIS — J45909 Unspecified asthma, uncomplicated: Secondary | ICD-10-CM | POA: Insufficient documentation

## 2021-04-30 DIAGNOSIS — R112 Nausea with vomiting, unspecified: Secondary | ICD-10-CM | POA: Diagnosis not present

## 2021-04-30 DIAGNOSIS — R197 Diarrhea, unspecified: Secondary | ICD-10-CM | POA: Diagnosis not present

## 2021-04-30 DIAGNOSIS — F17210 Nicotine dependence, cigarettes, uncomplicated: Secondary | ICD-10-CM | POA: Insufficient documentation

## 2021-04-30 DIAGNOSIS — R1032 Left lower quadrant pain: Secondary | ICD-10-CM | POA: Insufficient documentation

## 2021-04-30 DIAGNOSIS — R1031 Right lower quadrant pain: Secondary | ICD-10-CM | POA: Insufficient documentation

## 2021-04-30 DIAGNOSIS — Z20822 Contact with and (suspected) exposure to covid-19: Secondary | ICD-10-CM | POA: Diagnosis not present

## 2021-04-30 DIAGNOSIS — R630 Anorexia: Secondary | ICD-10-CM | POA: Diagnosis not present

## 2021-04-30 LAB — COMPREHENSIVE METABOLIC PANEL
ALT: 61 U/L — ABNORMAL HIGH (ref 0–44)
AST: 39 U/L (ref 15–41)
Albumin: 4.2 g/dL (ref 3.5–5.0)
Alkaline Phosphatase: 80 U/L (ref 38–126)
Anion gap: 10 (ref 5–15)
BUN: 9 mg/dL (ref 6–20)
CO2: 24 mmol/L (ref 22–32)
Calcium: 8.9 mg/dL (ref 8.9–10.3)
Chloride: 95 mmol/L — ABNORMAL LOW (ref 98–111)
Creatinine, Ser: 0.67 mg/dL (ref 0.44–1.00)
GFR, Estimated: >60 mL/min (ref >60)
Glucose, Bld: 284 mg/dL — ABNORMAL HIGH (ref 70–99)
Potassium: 3.6 mmol/L (ref 3.5–5.1)
Sodium: 129 mmol/L — ABNORMAL LOW (ref 135–145)
Total Bilirubin: 0.7 mg/dL (ref 0.3–1.2)
Total Protein: 7.4 g/dL (ref 6.5–8.1)

## 2021-04-30 LAB — URINALYSIS, ROUTINE W REFLEX MICROSCOPIC
Bilirubin Urine: NEGATIVE
Glucose, UA: >=500 mg/dL — AB
Hgb urine dipstick: NEGATIVE
Ketones, ur: 15 mg/dL — AB
Leukocytes,Ua: NEGATIVE
Nitrite: NEGATIVE
Protein, ur: 100 mg/dL — AB
Specific Gravity, Urine: 1.02 (ref 1.005–1.030)
pH: 7 (ref 5.0–8.0)

## 2021-04-30 LAB — CBC WITH DIFFERENTIAL/PLATELET
Abs Immature Granulocytes: 0.03 10*3/uL (ref 0.00–0.07)
Basophils Absolute: 0 10*3/uL (ref 0.0–0.1)
Basophils Relative: 0 %
Eosinophils Absolute: 0.1 10*3/uL (ref 0.0–0.5)
Eosinophils Relative: 2 %
HCT: 50.9 % — ABNORMAL HIGH (ref 36.0–46.0)
Hemoglobin: 17.6 g/dL — ABNORMAL HIGH (ref 12.0–15.0)
Immature Granulocytes: 1 %
Lymphocytes Relative: 19 %
Lymphs Abs: 1.1 10*3/uL (ref 0.7–4.0)
MCH: 29.5 pg (ref 26.0–34.0)
MCHC: 34.6 g/dL (ref 30.0–36.0)
MCV: 85.3 fL (ref 80.0–100.0)
Monocytes Absolute: 0.3 10*3/uL (ref 0.1–1.0)
Monocytes Relative: 6 %
Neutro Abs: 4.2 10*3/uL (ref 1.7–7.7)
Neutrophils Relative %: 72 %
Platelets: 224 10*3/uL (ref 150–400)
RBC: 5.97 MIL/uL — ABNORMAL HIGH (ref 3.87–5.11)
RDW: 12 % (ref 11.5–15.5)
WBC: 5.8 10*3/uL (ref 4.0–10.5)
nRBC: 0 % (ref 0.0–0.2)

## 2021-04-30 LAB — URINALYSIS, MICROSCOPIC (REFLEX)

## 2021-04-30 LAB — RESP PANEL BY RT-PCR (FLU A&B, COVID) ARPGX2
Influenza A by PCR: NEGATIVE
Influenza B by PCR: NEGATIVE
SARS Coronavirus 2 by RT PCR: NEGATIVE

## 2021-04-30 LAB — LIPASE, BLOOD: Lipase: 28 U/L (ref 11–51)

## 2021-04-30 LAB — PREGNANCY, URINE: Preg Test, Ur: NEGATIVE

## 2021-04-30 MED ORDER — ACETAMINOPHEN 325 MG PO TABS
650.0000 mg | ORAL_TABLET | Freq: Once | ORAL | Status: AC
  Administered 2021-04-30: 650 mg via ORAL
  Filled 2021-04-30: qty 2

## 2021-04-30 MED ORDER — SODIUM CHLORIDE 0.9 % IV BOLUS
1000.0000 mL | Freq: Once | INTRAVENOUS | Status: AC
  Administered 2021-04-30: 1000 mL via INTRAVENOUS

## 2021-04-30 MED ORDER — ALUM & MAG HYDROXIDE-SIMETH 200-200-20 MG/5ML PO SUSP
30.0000 mL | Freq: Once | ORAL | Status: AC
  Administered 2021-04-30: 30 mL via ORAL
  Filled 2021-04-30: qty 30

## 2021-04-30 MED ORDER — MORPHINE SULFATE (PF) 4 MG/ML IV SOLN
4.0000 mg | Freq: Once | INTRAVENOUS | Status: AC
  Administered 2021-04-30: 4 mg via INTRAVENOUS
  Filled 2021-04-30: qty 1

## 2021-04-30 MED ORDER — IOHEXOL 300 MG/ML  SOLN
100.0000 mL | Freq: Once | INTRAMUSCULAR | Status: AC | PRN
  Administered 2021-04-30: 100 mL via INTRAVENOUS

## 2021-04-30 MED ORDER — ONDANSETRON 4 MG PO TBDP: ORAL_TABLET | ORAL | 0 refills | Status: DC

## 2021-04-30 MED ORDER — ONDANSETRON HCL 4 MG/2ML IJ SOLN
4.0000 mg | Freq: Once | INTRAMUSCULAR | Status: AC
  Administered 2021-04-30: 4 mg via INTRAVENOUS
  Filled 2021-04-30: qty 2

## 2021-04-30 NOTE — Discharge Instructions (Addendum)
Try pepcid or tagamet up to twice a day.  Try to avoid things that may make this worse, most commonly these are spicy foods tomato based products fatty foods chocolate and peppermint.  Alcohol and tobacco can also make this worse.  Return to the emergency department for sudden worsening pain fever or inability to eat or drink.  

## 2021-04-30 NOTE — ED Notes (Signed)
ED Provider at bedside. 

## 2021-04-30 NOTE — ED Provider Notes (Signed)
[facility] EMERGENCY DEPARTMENT Provider Note   CSN: [id number] Arrival date & time: 04/30/21  1419     History Chief Complaint  Patient presents with   Abdominal Pain    Cassie Blake is a 30 y.o. female.  30 yo F with a  cc of abdominal pain, vomiting, diarrhea and fever.  Going on since 7pm last night.  Had a dinner party and after everyone left started feeling bad.  No one else sick.  No suspicious food or drink intake.  No dark or bloody stools.  Pain to the lower abdomen.  Denies urinary symptoms, denies vaginal bleeding, discharge.  No prior abdominal surgery.   The history is provided by the patient.  Abdominal Pain Pain location:  LLQ, RLQ and suprapubic Pain quality: aching and cramping   Pain radiates to:  Does not radiate Pain severity:  Moderate Onset quality:  Gradual Duration:  15 hours Timing:  Constant Progression:  Worsening Chronicity:  New Relieved by:  Nothing Worsened by:  Nothing Ineffective treatments:  None tried Associated symptoms: diarrhea, nausea and vomiting   Associated symptoms: no chest pain, no chills, no dysuria, no fever and no shortness of breath       Past Medical History:  Diagnosis Date   Allergy    Anxiety    Asthma    Clostridium difficile infection    being treated currently   Constipation, chronic    Depression    GERD (gastroesophageal reflux disease)    H/O drug abuse (HCC)    Impetigo    Polycystic ovary syndrome     Patient Active Problem List   Diagnosis Date Noted   Ureteral stone with hydronephrosis 05/28/2015    Past Surgical History:  Procedure Laterality Date   CYSTOSCOPY/URETEROSCOPY/HOLMIUM LASER/STENT PLACEMENT Right 05/28/2015   Procedure: CYSTOSCOPY/RIGHT RETROGRADE PYELOGRAM/ RIGHT URETEROSCOPY/STONE BASKETRY/HOLMIUM LASER APPLICATION;  Surgeon: Heloise PurpuraLester Borden, MD;  Location: [facility];  Service: Urology;  Laterality: Right;   PILONIDAL CYST DRAINAGE       OB History   No obstetric history  on file.     History reviewed. No pertinent family history.  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    Home Medications Prior to Admission medications   Medication Sig Start Date End Date Taking? Authorizing Provider  ondansetron (ZOFRAN ODT) 4 MG disintegrating tablet 4mg  ODT q4 hours prn nausea/vomit 04/30/21  Yes Melene PlanFloyd, Sandrea Boer, DO  albuterol (PROVENTIL HFA;VENTOLIN HFA) 108 (90 BASE) MCG/ACT inhaler Inhale 1-2 puffs into the lungs every 6 (six) hours as needed for wheezing. 05/28/15 01/10/17  Heloise PurpuraBorden, Lester, MD  amoxicillin (AMOXIL) 500 MG capsule Take 1 capsule (500 mg total) by mouth 3 (three) times daily. 09/05/20   Benjiman CorePickering, Nathan, MD  Dextromethorphan-Guaifenesin (MUCINEX DM MAXIMUM STRENGTH) 60-1200 MG TB12 Take 1 tablet by mouth every 12 (twelve) hours. 03/06/17   McVey, Madelaine BhatElizabeth Whitney, PA-C  ibuprofen (ADVIL,MOTRIN) 200 MG tablet Take 400-800 mg by mouth every 4 (four) hours as needed for fever, headache, mild pain, moderate pain or  cramping.    [provider]  lidocaine (LIDODERM) 5 % Place 1 patch onto the skin daily. Remove & Discard patch within 12 hours or as directed by MD 03/06/17   McVey, Madelaine Bhat, PA-C  predniSONE (DELTASONE) 20 MG tablet Take 2 tablets (40 mg total) by mouth daily. 09/05/20   Benjiman Core, MD    Allergies    Patient has no known allergies.  Review of Systems   Review of Systems  Constitutional:  Negative for chills and fever.  HENT:  Negative for congestion and rhinorrhea.   Eyes:  Negative for redness and visual disturbance.  Respiratory:  Negative for shortness of breath  and wheezing.   Cardiovascular:  Negative for chest pain and palpitations.  Gastrointestinal:  Positive for abdominal pain, diarrhea, nausea and vomiting.  Genitourinary:  Negative for dysuria and urgency.  Musculoskeletal:  Negative for arthralgias and myalgias.  Skin:  Negative for pallor and wound.  Neurological:  Negative for dizziness and headaches.   Physical Exam Updated Vital Signs BP 110/69   Pulse 95   Temp 100.1 F (37.8 C) (Oral)   Resp 17   Ht 5\' 6"  (1.676 m)   Wt 136.1 kg   SpO2 95%   BMI 48.42 kg/m   Physical Exam Vitals and nursing note reviewed.  Constitutional:      General: She is not in acute distress.    Appearance: She is well-developed. She is not diaphoretic.  HENT:     Head: Normocephalic and atraumatic.  Eyes:     Pupils: Pupils are equal, round, and reactive to light.  Cardiovascular:     Rate and Rhythm: Normal rate and regular rhythm.     Heart sounds: No murmur heard.   No friction rub. No gallop.  Pulmonary:     Effort: Pulmonary effort is normal.     Breath sounds: No wheezing or rales.  Abdominal:     General: There is no distension.     Palpations: Abdomen is soft.     Tenderness: There is abdominal tenderness.     Comments: Diffusely tender to the lower abdomen, worst to the RLQ.  No guarding, no rebound, negative psoas, obturator, rosvigs.   Musculoskeletal:        General: No tenderness.     Cervical back: Normal range of motion and neck supple.  Skin:    General: Skin is warm and dry.  Neurological:     Mental Status: She is alert and oriented to person, place, and time.  Psychiatric:        Behavior: Behavior normal.    ED Results / Procedures / Treatments   Labs (all labs ordered are listed, but only abnormal results are displayed) Labs Reviewed  URINALYSIS, ROUTINE W REFLEX MICROSCOPIC - Abnormal; Notable for the following components:      Result Value   Glucose, UA >=500 (*)    Ketones, ur 15 (*)    Protein, ur 100  (*)    All other components within normal limits  CBC WITH DIFFERENTIAL/PLATELET - Abnormal; Notable for the following components:   RBC 5.97 (*)    Hemoglobin 17.6 (*)    HCT 50.9 (*)    All other components within normal limits  COMPREHENSIVE METABOLIC PANEL - Abnormal; Notable for the following components:   Sodium 129 (*)    Chloride 95 (*)    Glucose, Bld 284 (*)    ALT 61 (*)    All other components within normal  limits  URINALYSIS, MICROSCOPIC (REFLEX) - Abnormal; Notable for the following components:   Bacteria, UA MANY (*)    All other components within normal limits  RESP PANEL BY RT-PCR (FLU A&B, COVID) ARPGX2  PREGNANCY, URINE  LIPASE, BLOOD    EKG EKG Interpretation  Date/Time:  Sunday April 30 2021 14:41:31 EDT Ventricular Rate:  133 PR Interval:  138 QRS Duration: 80 QT Interval:  386 QTC Calculation: 574 R Axis:   46 Text Interpretation: Sinus tachycardia  st changes likely rate related Abnormal ECG No old tracing to compare Confirmed by Melene Plan [phone]) on 04/30/2021 3:05:45 PM  Radiology CT ABDOMEN PELVIS W CONTRAST  Result Date: 04/30/2021 CLINICAL DATA:  Lower abdominal pain with nausea, vomiting, and diarrhea. EXAM: CT ABDOMEN AND PELVIS WITH CONTRAST TECHNIQUE: Multidetector CT imaging of the abdomen and pelvis was performed using the standard protocol following bolus administration of intravenous contrast. CONTRAST:  OMNIPAQUE IOHEXOL 300 MG/ML  SOLN COMPARISON:  CT abdomen pelvis dated January 10, 2017. FINDINGS: Lower chest: No acute abnormality. Mild right lower lobe atelectasis. Hepatobiliary: Unchanged severe hepatic steatosis. No focal liver abnormality. The gallbladder is unremarkable. No biliary dilatation. Pancreas: Unremarkable. No pancreatic ductal dilatation or surrounding inflammatory changes. Spleen: Normal in size without focal abnormality. Adrenals/Urinary Tract: The adrenal glands are unremarkable. No renal mass. Unchanged punctate  calculus in the lower pole of the left kidney. New punctate calculus in the lower pole of the right kidney. No hydronephrosis. The bladder is decompressed. Stomach/Bowel: Stomach is within normal limits. Appendix appears normal. No evidence of bowel wall thickening, distention, or inflammatory changes. Vascular/Lymphatic: No significant vascular findings are present. No enlarged abdominal or pelvic lymph nodes. Reproductive: Uterus and bilateral adnexa are unremarkable. Other: No abdominal wall hernia or abnormality. No abdominopelvic ascites. No pneumoperitoneum. Musculoskeletal: No acute or significant osseous findings. IMPRESSION: 1. No acute intra-abdominal process.  Normal appendix. 2. Unchanged severe hepatic steatosis. 3. Unchanged nonobstructive left nephrolithiasis. New nonobstructive right nephrolithiasis. Electronically Signed   By: Obie Dredge M.D.   On: 04/30/2021 15:56   DG Chest Portable 1 View  Result Date: 04/30/2021 CLINICAL DATA:  Chest pain and shortness of breath. Patient reports lower abdominal pain. Fever. EXAM: PORTABLE CHEST 1 VIEW COMPARISON:  Radiograph 10/18/2011 FINDINGS: The cardiomediastinal contours are normal. The lungs are clear. Pulmonary vasculature is normal. No consolidation, pleural effusion, or pneumothorax. No acute osseous abnormalities are seen. IMPRESSION: Negative AP view of the chest. Electronically Signed   By: Narda Rutherford M.D.   On: 04/30/2021 16:00    Procedures Procedures   Medications Ordered in ED Medications  ondansetron (ZOFRAN) injection 4 mg (4 mg Intravenous Given 04/30/21 1514)  sodium chloride 0.9 % bolus 1,000 mL ( Intravenous Stopped 04/30/21 1708)  acetaminophen (TYLENOL) tablet 650 mg (650 mg Oral Given 04/30/21 1512)  morphine 4 MG/ML injection 4 mg (4 mg Intravenous Given 04/30/21 1514)  iohexol (OMNIPAQUE) 300 MG/ML solution 100 mL (100 mLs Intravenous Contrast Given 04/30/21 1539)  alum & mag hydroxide-simeth (MAALOX/MYLANTA)  200-200-20 MG/5ML suspension 30 mL (30 mLs Oral Given 04/30/21 1622)    ED Course  I have reviewed the triage vital signs and the nursing notes.  Pertinent labs & imaging results that were available during my care of the patient were reviewed by me and considered in my medical decision making (see chart for details).    MDM Rules/Calculators/A&P  30 yo F with a cc of lower abdominal pain n/v/d.  Likely viral syndrome by history but pain worst to the RLQ.  Anorexia today, will CT to eval for appy.  CT negative, labs with dehydration.  Improving post pain and nausea medicine and IV fluids.  Tolerating p.o.  Discharge home.  6:51 PM:  I have discussed the diagnosis/risks/treatment options with the patient and believe the pt to be eligible for discharge home to follow-up with PCP. We also discussed returning to the ED immediately if new or worsening sx occur. We discussed the sx which are most concerning (e.g., sudden worsening pain, fever, inability to tolerate by mouth) that necessitate immediate return. Medications administered to the patient during their visit and any new prescriptions provided to the patient are listed below.  Medications given during this visit Medications  ondansetron (ZOFRAN) injection 4 mg (4 mg Intravenous Given 04/30/21 1514)  sodium chloride 0.9 % bolus 1,000 mL ( Intravenous Stopped 04/30/21 1708)  acetaminophen (TYLENOL) tablet 650 mg (650 mg Oral Given 04/30/21 1512)  morphine 4 MG/ML injection 4 mg (4 mg Intravenous Given 04/30/21 1514)  iohexol (OMNIPAQUE) 300 MG/ML solution 100 mL (100 mLs Intravenous Contrast Given 04/30/21 1539)  alum & mag hydroxide-simeth (MAALOX/MYLANTA) 200-200-20 MG/5ML suspension 30 mL (30 mLs Oral Given 04/30/21 1622)     The patient appears reasonably screen and/or stabilized for discharge and I doubt any other medical condition or other [facility] requiring further screening, evaluation, or treatment in the ED at this  time prior to discharge.   Final Clinical Impression(s) / ED Diagnoses Final diagnoses:  Nausea vomiting and diarrhea    Rx / DC Orders ED Discharge Orders          Ordered    ondansetron (ZOFRAN ODT) 4 MG disintegrating tablet        04/30/21 1609             Melene Plan, DO 04/30/21 1851

## 2021-04-30 NOTE — ED Triage Notes (Signed)
Pt arrives pov with c/o lower abdominal pain, N/V/D that started last night. Pt also reports fever. Pt nauseous in triage, dry heaving. Pt reports mild CP and shob. Endorses marijuana use approx 4 hrs pta

## 2022-01-24 ENCOUNTER — Emergency Department (HOSPITAL_BASED_OUTPATIENT_CLINIC_OR_DEPARTMENT_OTHER): Payer: BC Managed Care – PPO

## 2022-01-24 ENCOUNTER — Encounter (HOSPITAL_BASED_OUTPATIENT_CLINIC_OR_DEPARTMENT_OTHER): Payer: Self-pay

## 2022-01-24 ENCOUNTER — Other Ambulatory Visit: Payer: Self-pay

## 2022-01-24 ENCOUNTER — Emergency Department (HOSPITAL_BASED_OUTPATIENT_CLINIC_OR_DEPARTMENT_OTHER)
Admission: EM | Admit: 2022-01-24 | Discharge: 2022-01-24 | Disposition: A | Discharge status: Home / Self Care | Payer: BC Managed Care – PPO | Attending: Emergency Medicine | Admitting: Emergency Medicine

## 2022-01-24 DIAGNOSIS — R1032 Left lower quadrant pain: Secondary | ICD-10-CM | POA: Diagnosis present

## 2022-01-24 DIAGNOSIS — R339 Retention of urine, unspecified: Secondary | ICD-10-CM | POA: Diagnosis not present

## 2022-01-24 DIAGNOSIS — N390 Urinary tract infection, site not specified: Secondary | ICD-10-CM | POA: Insufficient documentation

## 2022-01-24 DIAGNOSIS — R338 Other retention of urine: Secondary | ICD-10-CM

## 2022-01-24 LAB — COMPREHENSIVE METABOLIC PANEL
ALT: 37 U/L (ref 0–44)
AST: 25 U/L (ref 15–41)
Albumin: 4 g/dL (ref 3.5–5.0)
Alkaline Phosphatase: 55 U/L (ref 38–126)
Anion gap: 8 (ref 5–15)
BUN: 10 mg/dL (ref 6–20)
CO2: 24 mmol/L (ref 22–32)
Calcium: 9 mg/dL (ref 8.9–10.3)
Chloride: 102 mmol/L (ref 98–111)
Creatinine, Ser: 0.65 mg/dL (ref 0.44–1.00)
GFR, Estimated: >60 mL/min (ref >60)
Glucose, Bld: 150 mg/dL — ABNORMAL HIGH (ref 70–99)
Potassium: 4.1 mmol/L (ref 3.5–5.1)
Sodium: 134 mmol/L — ABNORMAL LOW (ref 135–145)
Total Bilirubin: 0.3 mg/dL (ref 0.3–1.2)
Total Protein: 7.3 g/dL (ref 6.5–8.1)

## 2022-01-24 LAB — URINALYSIS, ROUTINE W REFLEX MICROSCOPIC
Bilirubin Urine: NEGATIVE
Glucose, UA: NEGATIVE mg/dL
Ketones, ur: NEGATIVE mg/dL
Nitrite: POSITIVE — AB
Protein, ur: NEGATIVE mg/dL
Specific Gravity, Urine: 1.01 (ref 1.005–1.030)
pH: 6.5 (ref 5.0–8.0)

## 2022-01-24 LAB — CBC
HCT: 45.8 % (ref 36.0–46.0)
Hemoglobin: 16.2 g/dL — ABNORMAL HIGH (ref 12.0–15.0)
MCH: 30.4 pg (ref 26.0–34.0)
MCHC: 35.4 g/dL (ref 30.0–36.0)
MCV: 85.9 fL (ref 80.0–100.0)
Platelets: 275 10*3/uL (ref 150–400)
RBC: 5.33 MIL/uL — ABNORMAL HIGH (ref 3.87–5.11)
RDW: 11.9 % (ref 11.5–15.5)
WBC: 9.2 10*3/uL (ref 4.0–10.5)
nRBC: 0 % (ref 0.0–0.2)

## 2022-01-24 LAB — LIPASE, BLOOD: Lipase: 37 U/L (ref 11–51)

## 2022-01-24 LAB — URINALYSIS, MICROSCOPIC (REFLEX)

## 2022-01-24 LAB — HCG, SERUM, QUALITATIVE: Preg, Serum: NEGATIVE

## 2022-01-24 MED ORDER — ACETAMINOPHEN 500 MG PO TABS
1000.0000 mg | ORAL_TABLET | Freq: Once | ORAL | Status: AC
  Administered 2022-01-24: 1000 mg via ORAL
  Filled 2022-01-24: qty 2

## 2022-01-24 MED ORDER — HYDROMORPHONE HCL 1 MG/ML IJ SOLN
1.0000 mg | Freq: Once | INTRAMUSCULAR | Status: AC
  Administered 2022-01-24: 1 mg via INTRAVENOUS
  Filled 2022-01-24: qty 1

## 2022-01-24 MED ORDER — SODIUM CHLORIDE 0.9 % IV BOLUS
1000.0000 mL | Freq: Once | INTRAVENOUS | Status: AC
  Administered 2022-01-24: 1000 mL via INTRAVENOUS

## 2022-01-24 MED ORDER — CEPHALEXIN 500 MG PO CAPS: 500.0000 mg | ORAL_CAPSULE | Freq: Two times a day (BID) | ORAL | 0 refills | Status: AC

## 2022-01-24 MED ORDER — CEPHALEXIN 250 MG PO CAPS
500.0000 mg | ORAL_CAPSULE | Freq: Once | ORAL | Status: AC
  Administered 2022-01-24: 500 mg via ORAL
  Filled 2022-01-24: qty 2

## 2022-01-24 MED ORDER — IOHEXOL 300 MG/ML  SOLN
100.0000 mL | Freq: Once | INTRAMUSCULAR | Status: AC | PRN
  Administered 2022-01-24: 100 mL via INTRAVENOUS

## 2022-01-24 MED ORDER — KETOROLAC TROMETHAMINE 15 MG/ML IJ SOLN
15.0000 mg | Freq: Once | INTRAMUSCULAR | Status: AC
  Administered 2022-01-24: 15 mg via INTRAVENOUS
  Filled 2022-01-24: qty 1

## 2022-01-24 NOTE — ED Provider Notes (Signed)
?[facility] EMERGENCY DEPARTMENT ?Provider Note ? ? ?CSN: [id number] ?Arrival date & time: 01/24/22  0827 ? ?  ? ?History ? ?Chief Complaint  ?Patient presents with  ? Abdominal Pain  ? ? ?Cassie Blake is a 31 y.o. female. ? ?HPI ?31 year old female presents with sudden, severe lower abdominal pain.  It is diffuse across her lower abdomen.  She has had kidney stones before but typically that causes more back pain.  She felt "crummy" last night but not sure if it is related.  At times the pain has become so severe it makes her nauseated but she has not vomited.  She has some difficulty urinating and only gets out small drops.  She does feel an urgency to go but not really a pain.  No vaginal symptoms such as bleeding, discharge, or concern for STI.  She has PCOS so she has not had a menstrual cycle in quite some time.  No fevers.  Has not taken anything for the pain. ? ?Home Medications ?Prior to Admission medications   ?Medication Sig Start Date End Date Taking? Authorizing Provider  ?cephALEXin (KEFLEX) 500 MG capsule Take 1 capsule (500 mg total) by mouth 2 (two) times daily for 7 days. 01/24/22 01/31/22 Yes Sherwood Gambler, MD  ?albuterol (PROVENTIL HFA;VENTOLIN HFA) 108 (90 BASE) MCG/ACT inhaler Inhale 1-2 puffs into the lungs every 6 (six) hours as needed for wheezing. 05/28/15 01/10/17  Raynelle Bring, MD  ?Dextromethorphan-Guaifenesin [facility] DM MAXIMUM STRENGTH) 60-1200 MG TB12 Take 1 tablet by mouth every 12 (twelve) hours. 03/06/17   McVey, Gelene Mink, PA-C  ?ibuprofen (ADVIL,MOTRIN) 200 MG tablet Take 400-800 mg by mouth every 4 (four) hours as needed for fever, headache, mild pain, moderate pain or cramping.    [provider]  ?lidocaine (LIDODERM) 5 % Place 1 patch onto the skin daily. Remove & Discard patch within 12 hours or as directed by MD 03/06/17   McVey, Gelene Mink, PA-C  ?ondansetron (ZOFRAN ODT) 4 MG disintegrating tablet 4mg  ODT q4 hours prn nausea/vomit 04/30/21    Deno Etienne, DO  ?predniSONE (DELTASONE) 20 MG tablet Take 2 tablets (40 mg total) by mouth daily. 09/05/20   Davonna Belling, MD  ?   ? ?Allergies    ?Patient has no known allergies.   ? ?Review of Systems   ?Review of Systems  ?Constitutional:  Negative for fever.  ?Gastrointestinal:  Positive for abdominal pain and nausea. Negative for vomiting.  ?Genitourinary:  Positive for difficulty urinating. Negative for dysuria.  ?Musculoskeletal:  Positive for back pain (chronic, unchanged from baseline).  ? ?Physical Exam ?Updated Vital Signs ?BP 126/80   Pulse 81   Temp 98.1 ?F (36.7 ?C)   Resp 18   Ht 5\' 6"  (1.676 m)   Wt 136.1 kg   SpO2 98%   BMI 48.42 kg/m?  ?Physical Exam ?Vitals and nursing note reviewed.  ?Constitutional:   ?   Appearance: She is well-developed. She is obese. She is not diaphoretic.  ?   Comments: Patient is tearful, in pain  ?HENT:  ?   Head: Normocephalic and atraumatic.  ?Cardiovascular:  ?   Rate and Rhythm: Normal rate and regular rhythm.  ?   Heart sounds: Normal heart sounds.  ?Pulmonary:  ?   Effort: Pulmonary effort is normal.  ?   Breath sounds: Normal breath sounds.  ?Abdominal:  ?   Palpations: Abdomen is soft.  ?   Tenderness: There is abdominal tenderness in the suprapubic area and left  lower quadrant. There is no right CVA tenderness or left CVA tenderness.  ?Skin: ?   General: Skin is warm and dry.  ?Neurological:  ?   Mental Status: She is alert.  ? ? ?ED Results / Procedures / Treatments   ?Labs ?(all labs ordered are listed, but only abnormal results are displayed) ?Labs Reviewed  ?URINALYSIS, ROUTINE W REFLEX MICROSCOPIC - Abnormal; Notable for the following components:  ?    Result Value  ? Color, Urine STRAW (*)   ? APPearance HAZY (*)   ? Hgb urine dipstick MODERATE (*)   ? Nitrite POSITIVE (*)   ? Leukocytes,Ua SMALL (*)   ? All other components within normal limits  ?COMPREHENSIVE METABOLIC PANEL - Abnormal; Notable for the following components:  ? Sodium 134 (*)    ? Glucose, Bld 150 (*)   ? All other components within normal limits  ?CBC - Abnormal; Notable for the following components:  ? RBC 5.33 (*)   ? Hemoglobin 16.2 (*)   ? All other components within normal limits  ?URINALYSIS, MICROSCOPIC (REFLEX) - Abnormal; Notable for the following components:  ? Bacteria, UA FEW (*)   ? All other components within normal limits  ?URINE CULTURE  ?LIPASE, BLOOD  ?HCG, SERUM, QUALITATIVE  ? ? ?EKG ?None ? ?Radiology ?CT ABDOMEN PELVIS W CONTRAST ? ?Result Date: 01/24/2022 ?CLINICAL DATA:  LLQ abdominal pain EXAM: CT ABDOMEN AND PELVIS WITH CONTRAST TECHNIQUE: Multidetector CT imaging of the abdomen and pelvis was performed using the standard protocol following bolus administration of intravenous contrast. RADIATION DOSE REDUCTION: This exam was performed according to the departmental dose-optimization program which includes automated exposure control, adjustment of the mA and/or kV according to patient size and/or use of iterative reconstruction technique. CONTRAST:  186mL OMNIPAQUE IOHEXOL 300 MG/ML  SOLN COMPARISON:  July 2022 FINDINGS: Inferior chest: Partially visualized focus of ground-glass consolidation in the mid right lung. Hepatobiliary: Hepatic steatosis. No focal liver lesion. No intrahepatic or extrahepatic biliary ductal dilation. The gallbladder appears normal. Spleen: Normal in size without focal abnormality. Pancreas: No pancreatic ductal dilatation or surrounding inflammatory changes. Adrenals/Urinary Tract: Adrenal glands are unremarkable. The kidneys are normal in size without focal lesion. No significant hydronephrosis. Punctate nonobstructive nephrolithiasis bilaterally. There are subtle inflammatory changes and fat stranding surrounding the left mid ureter (see key image). Bladder is unremarkable. Stomach/Bowel: The stomach, small bowel and large bowel are normal in caliber without abnormal wall thickening or surrounding inflammatory changes. Reproductive:  Uterus and bilateral adnexa are unremarkable. Normal physiologic appearance. Lymphatic: Several small reactive appearing periaortic lymph nodes are identified in the left retroperitoneum. Vasculature: The abdominal aorta is normal in caliber. The portal venous system is patent. Other: No abdominopelvic ascites. Musculoskeletal: No aggressive osseous lesions. Degenerative changes at L5-S1. The soft tissues are unremarkable. IMPRESSION: 1. There are subtle inflammatory changes surrounding the left mid ureter, suspicious for ascending urinary tract infection, and would appear compatible with recent urinalysis. 2. There is a partially visualized focus of ground-glass consolidation in the right mid lung, possibly infectious. Chest radiograph is recommended to complete evaluation. 3. Hepatic steatosis. 4. Bilateral punctate nonobstructive nephrolithiasis. Electronically Signed   By: Albin Felling M.D.   On: 01/24/2022 10:29  ? ?US PELVIC COMPLETE W TRANSVAGINAL AND TORSION R/O ? ?Result Date: 01/24/2022 ?CLINICAL DATA:  LEFT lower quadrant pain possible UTI, history of PCOS EXAM: TRANSABDOMINAL AND TRANSVAGINAL ULTRASOUND OF PELVIS DOPPLER ULTRASOUND OF OVARIES TECHNIQUE: Both transabdominal and transvaginal ultrasound examinations of the pelvis were  performed. Transabdominal technique was performed for global imaging of the pelvis including uterus, ovaries, adnexal regions, and pelvic cul-de-sac. It was necessary to proceed with endovaginal exam following the transabdominal exam to visualize the endometrium. Color and duplex Doppler ultrasound was utilized to evaluate blood flow to the ovaries. COMPARISON:  05/10/2015 FINDINGS: Uterus Measurements: 8.3 x 2.2 x 2.6 cm = volume: 24 mL. Anteverted. Slightly heterogeneous myometrium. No focal mass. Endometrium Thickness: 3 mm.  No endometrial fluid or mass Right ovary Measurements: 3.3 x 1.3 x 2.5 cm = volume: 5.6 mL. Not identified on transvaginal imaging due to body  habitus and bowel. Normal morphology without mass Left ovary Measurements: 3.9 x 3.5 x 4.0 cm = volume: 28 mL. Not identified on transvaginal imaging due to body habitus and bowel. Normal morphology without mass Pulsed

## 2022-01-24 NOTE — ED Notes (Signed)
Bladder scan 475ml

## 2022-01-24 NOTE — ED Notes (Signed)
Lab notified to add urine culture 

## 2022-01-24 NOTE — ED Triage Notes (Signed)
Pt reports waking up with intense lower abdominal. States only able to urinate a few drops. Nauseated, no vomiting ?

## 2022-01-24 NOTE — ED Notes (Signed)
Patient transported to CT 

## 2022-01-24 NOTE — Discharge Instructions (Addendum)
If you develop worsening, continued, or recurrent abdominal pain, uncontrolled vomiting, fever, chest or back pain, or any other new/concerning symptoms then return to the ER for evaluation.  

## 2022-01-26 LAB — URINE CULTURE: Culture: >=100000 — AB

## 2022-01-27 ENCOUNTER — Telehealth (HOSPITAL_BASED_OUTPATIENT_CLINIC_OR_DEPARTMENT_OTHER): Payer: Self-pay | Admitting: *Deleted

## 2022-01-27 NOTE — Telephone Encounter (Signed)
Post ED Visit - Positive Culture Follow-up ? ?Culture report reviewed by antimicrobial stewardship pharmacist: ?[facility] ?[]  , Enzo Bi.D. ?[]  [address], Pharm.D., BCPS AQ-ID ?[]  , Pharm.D., BCPS ?[]  Celedonio Miyamoto, .D., BCPS ?[]  [place], .D., BCPS, AAHIVP ?[]  Georgina Pillion, Pharm.D., BCPS, AAHIVP ?[]  [address], PharmD, BCPS ?[]  , PharmD, BCPS ?[]  [place], PharmD, BCPS ?[]  [address], PharmD ?[]  , PharmD, BCPS ?[x]  Estella Husk, PharmD ? ? [facility] ?[]  Lysle Pearl, PharmD ?[]  , PharmD ?[]  Phillips Climes, PharmD ?[]  , Rph ?[]  Agapito Games) , PharmD ?[]  Verlan Friends, PharmD ?[]  , PharmD ?[]  Mervyn Gay, PharmD ?[]  , PharmD ?[]  Vinnie Level, PharmD ?[]  Gerri Spore, PharmD ?[]  , PharmD ?[]  Len Childs, PharmD ? ? ?Positive urine culture ?Treated with Cephalexin, organism sensitive to the same and no further patient follow-up is required at this time. ? ? ?01/27/2022, 11:32 AM ?  ?

## 2023-05-07 ENCOUNTER — Other Ambulatory Visit: Payer: Self-pay | Admitting: Orthopedic Surgery

## 2023-05-15 NOTE — Progress Notes (Signed)
Surgical Instructions   Your procedure is scheduled on Wednesday May 22, 2023. Report to [facility] Main Entrance "A" at 6:30 A.M., then check in with the Admitting office. Any questions or running late day of surgery: call [phone]  Questions prior to your surgery date: call [phone], Monday-Friday, 8am-4pm. If you experience any cold or flu symptoms such as cough, fever, chills, shortness of breath, etc. between now and your scheduled surgery, please notify us at the above number.     Remember:  Do not eat after midnight the night before your surgery  You may drink clear liquids until 5:30 the morning of your surgery.   Clear liquids allowed are: Water, Non-Citrus Juices (without pulp), Carbonated Beverages, Clear Tea, Black Coffee Only (NO MILK, CREAM OR POWDERED CREAMER of any kind), and Gatorade.   Patient Instructions  The night before surgery:  No food after midnight. ONLY clear liquids after midnight  The day of surgery (if you do NOT have diabetes):  Drink ONE (1) Pre-Surgery Clear Ensure by 5:30 the morning of surgery. Drink in one sitting. Do not sip.  This drink was given to you during your hospital  pre-op appointment visit.          If you have questions, please contact your surgeon's office.     Take these medicines as needed the morning of surgery with A SIP OF WATER :  HYDROcodone-acetaminophen (NORCO/VICODIN)  methocarbamol (ROBAXIN)    One week prior to surgery, STOP taking any Aspirin (unless otherwise instructed by your surgeon) Aleve, Naproxen, Ibuprofen, Motrin, Advil, Goody's, BC's, all herbal medications, fish oil, and non-prescription vitamins.                     Do NOT Smoke (Tobacco/Vaping) for 24 hours prior to your procedure.  If you use a CPAP at night, you may bring your mask/headgear for your overnight stay.   You will be asked to remove any contacts, glasses, piercing's, hearing aid's, dentures/partials prior to surgery. Please  bring cases for these items if needed.    Patients discharged the day of surgery will not be allowed to drive home, and someone needs to stay with them for 24 hours.  SURGICAL WAITING ROOM VISITATION Patients may have no more than 2 support people in the waiting area - these visitors may rotate.   Pre-op nurse will coordinate an appropriate time for 1 ADULT support person, who may not rotate, to accompany patient in pre-op.  Children under the age of 25 must have an adult with them who is not the patient and must remain in the main waiting area with an adult.  If the patient needs to stay at the hospital during part of their recovery, the visitor guidelines for inpatient rooms apply.  Please refer to the [facility] website for the visitor guidelines for any additional information.   If you received a COVID test during your pre-op visit  it is requested that you wear a mask when out in public, stay away from anyone that may not be feeling well and notify your surgeon if you develop symptoms. If you have been in contact with anyone that has tested positive in the last 10 days please notify you surgeon.      Pre-operative 5 CHG Bathing Instructions   You can play a key role in reducing the risk of infection after surgery. Your skin needs to be as free of germs as possible. You can reduce the number of  germs on your skin by washing with CHG (chlorhexidine gluconate) soap before surgery. CHG is an antiseptic soap that kills germs and continues to kill germs even after washing.   DO NOT use if you have an allergy to chlorhexidine/CHG or antibacterial soaps. If your skin becomes reddened or irritated, stop using the CHG and notify one of our RNs at [phone].   Please shower with the CHG soap starting 4 days before surgery using the following schedule:     Please keep in mind the following:  DO NOT shave, including legs and underarms, starting the day of your first shower.   You may shave  your face at any point before/day of surgery.  Place clean sheets on your bed the day you start using CHG soap. Use a clean washcloth (not used since being washed) for each shower. DO NOT sleep with pets once you start using the CHG.   CHG Shower Instructions:  If you choose to wash your hair and private area, wash first with your normal shampoo/soap.  After you use shampoo/soap, rinse your hair and body thoroughly to remove shampoo/soap residue.  Turn the water OFF and apply about 3 tablespoons (45 ml) of CHG soap to a CLEAN washcloth.  Apply CHG soap ONLY FROM YOUR NECK DOWN TO YOUR TOES (washing for 3-5 minutes)  DO NOT use CHG soap on face, private areas, open wounds, or sores.  Pay special attention to the area where your surgery is being performed.  If you are having back surgery, having someone wash your back for you may be helpful. Wait 2 minutes after CHG soap is applied, then you may rinse off the CHG soap.  Pat dry with a clean towel  Put on clean clothes/pajamas   If you choose to wear lotion, please use ONLY the CHG-compatible lotions on the back of this paper.   Additional instructions for the day of surgery: DO NOT APPLY any lotions, deodorants, cologne, or perfumes.   Do not bring valuables to the hospital. [facility] is not responsible for any belongings/valuables. Do not wear nail polish, gel polish, artificial nails, or any other type of covering on natural nails (fingers and toes) Do not wear jewelry or makeup Put on clean/comfortable clothes.  Please brush your teeth.  Ask your nurse before applying any prescription medications to the skin.     CHG Compatible Lotions   Aveeno Moisturizing lotion  Cetaphil Moisturizing Cream  Cetaphil Moisturizing Lotion  Clairol Herbal Essence Moisturizing Lotion, Dry Skin  Clairol Herbal Essence Moisturizing Lotion, Extra Dry Skin  Clairol Herbal Essence Moisturizing Lotion, Normal Skin  Curel Age Defying Therapeutic  Moisturizing Lotion with Alpha Hydroxy  Curel Extreme Care Body Lotion  Curel Soothing Hands Moisturizing Hand Lotion  Curel Therapeutic Moisturizing Cream, Fragrance-Free  Curel Therapeutic Moisturizing Lotion, Fragrance-Free  Curel Therapeutic Moisturizing Lotion, Original Formula  Eucerin Daily Replenishing Lotion  Eucerin Dry Skin Therapy Plus Alpha Hydroxy Crme  Eucerin Dry Skin Therapy Plus Alpha Hydroxy Lotion  Eucerin Original Crme  Eucerin Original Lotion  Eucerin Plus Crme Eucerin Plus Lotion  Eucerin TriLipid Replenishing Lotion  Keri Anti-Bacterial Hand Lotion  Keri Deep Conditioning Original Lotion Dry Skin Formula Softly Scented  Keri Deep Conditioning Original Lotion, Fragrance Free Sensitive Skin Formula  Keri Lotion Fast Absorbing Fragrance Free Sensitive Skin Formula  Keri Lotion Fast Absorbing Softly Scented Dry Skin Formula  Keri Original Lotion  Keri Skin Renewal Lotion Keri Silky Smooth Lotion  Keri Silky Smooth Sensitive Skin  Lotion  Nivea Body Creamy Conditioning Oil  Nivea Body Extra Enriched Teacher, adult education Moisturizing Lotion Nivea Crme  Nivea Skin Firming Lotion  NutraDerm 30 Skin Lotion  NutraDerm Skin Lotion  NutraDerm Therapeutic Skin Cream  NutraDerm Therapeutic Skin Lotion  ProShield Protective Hand Cream  Provon moisturizing lotion  Please read over the following fact sheets that you were given.

## 2023-05-16 ENCOUNTER — Other Ambulatory Visit: Payer: Self-pay

## 2023-05-16 ENCOUNTER — Encounter (HOSPITAL_COMMUNITY): Payer: Self-pay

## 2023-05-16 ENCOUNTER — Encounter (HOSPITAL_COMMUNITY)
Admission: RE | Admit: 2023-05-16 | Discharge: 2023-05-16 | Disposition: A | Discharge status: Home / Self Care | Payer: BC Managed Care – PPO | Source: Ambulatory Visit | Attending: Orthopedic Surgery | Admitting: Orthopedic Surgery

## 2023-05-16 VITALS — BP 143/87 | HR 87 | Temp 98.5°F | Resp 18 | Ht 66.0 in | Wt 281.0 lb

## 2023-05-16 DIAGNOSIS — Z01812 Encounter for preprocedural laboratory examination: Secondary | ICD-10-CM | POA: Insufficient documentation

## 2023-05-16 DIAGNOSIS — Z01818 Encounter for other preprocedural examination: Secondary | ICD-10-CM

## 2023-05-16 HISTORY — DX: Other complications of anesthesia, initial encounter: T88.59XA

## 2023-05-16 HISTORY — DX: Other specified postprocedural states: Z98.890

## 2023-05-16 LAB — TYPE AND SCREEN
ABO/RH(D): AB POS
Antibody Screen: NEGATIVE

## 2023-05-16 LAB — CBC
HCT: 45.8 % (ref 36.0–46.0)
Hemoglobin: 15.7 g/dL — ABNORMAL HIGH (ref 12.0–15.0)
MCH: 30.7 pg (ref 26.0–34.0)
MCHC: 34.3 g/dL (ref 30.0–36.0)
MCV: 89.6 fL (ref 80.0–100.0)
Platelets: 271 10*3/uL (ref 150–400)
RBC: 5.11 MIL/uL (ref 3.87–5.11)
RDW: 11.9 % (ref 11.5–15.5)
WBC: 6.6 10*3/uL (ref 4.0–10.5)
nRBC: 0 % (ref 0.0–0.2)

## 2023-05-16 LAB — SURGICAL PCR SCREEN
MRSA, PCR: NEGATIVE
Staphylococcus aureus: POSITIVE — AB

## 2023-05-16 NOTE — Progress Notes (Signed)
PCP -  denies Cardiologist - denies  PPM/ICD - denies Device Orders - n/a Rep Notified - n/a  Chest x-ray - n/a EKG - n/a Stress Test -  ECHO -  Cardiac Cath -   Sleep Study - denies CPAP - denies  Non-diabetic  Blood Thinner Instructions: denies Aspirin Instructions:denies  ERAS Protcol - Yes PRE-SURGERY Ensure  COVID TEST- n/a  Anesthesia review: No  Patient denies shortness of breath, fever, cough and chest pain at PAT appointment   All instructions explained to the patient, with a verbal understanding of the material. Patient agrees to go over the instructions while at home for a better understanding. Patient also instructed to self quarantine after being tested for COVID-19. The opportunity to ask questions was provided.

## 2023-05-21 NOTE — Anesthesia Preprocedure Evaluation (Signed)
Anesthesia Evaluation  Patient identified by MRN, date of birth, ID band Patient awake    Reviewed: Allergy & Precautions, NPO status , Patient's Chart, lab work & pertinent test results  History of Anesthesia Complications (+) PONV and history of anesthetic complications  Airway Mallampati: II  TM Distance: >3 FB Neck ROM: Full    Dental  (+) Teeth Intact, Dental Advisory Given   Pulmonary asthma , Patient abstained from smoking., former smoker   Pulmonary exam normal breath sounds clear to auscultation       Cardiovascular negative cardio ROS Normal cardiovascular exam Rhythm:Regular Rate:Normal     Neuro/Psych  PSYCHIATRIC DISORDERS Anxiety Depression    DEGENERATIVE DISC DISEASE, DISC HERNIATION    GI/Hepatic ,GERD  ,,(+)     substance abuse    Endo/Other    Morbid obesity  Renal/GU negative Renal ROS     Musculoskeletal negative musculoskeletal ROS (+)    Abdominal   Peds  Hematology negative hematology ROS (+)   Anesthesia Other Findings   Reproductive/Obstetrics                             Anesthesia Physical Anesthesia Plan  ASA: 3  Anesthesia Plan: General   Post-op Pain Management: Tylenol PO (pre-op)* and Ketamine IV*   Induction: Intravenous  PONV Risk Score and Plan: 4 or greater and Scopolamine patch - Pre-op, Midazolam, Dexamethasone, Ondansetron and Propofol infusion  Airway Management Planned: Oral ETT  Additional Equipment:   Intra-op Plan:   Post-operative Plan: Extubation in OR  Informed Consent: I have reviewed the patients History and Physical, chart, labs and discussed the procedure including the risks, benefits and alternatives for the proposed anesthesia with the patient or authorized representative who has indicated his/her understanding and acceptance.     Dental advisory given  Plan Discussed with: CRNA  Anesthesia Plan Comments: (2nd  PIV)        Anesthesia Quick Evaluation

## 2023-05-22 ENCOUNTER — Other Ambulatory Visit: Payer: Self-pay

## 2023-05-22 ENCOUNTER — Encounter (HOSPITAL_COMMUNITY): Payer: Self-pay | Admitting: Orthopedic Surgery

## 2023-05-22 ENCOUNTER — Ambulatory Visit (HOSPITAL_COMMUNITY): Payer: BC Managed Care – PPO | Admitting: Anesthesiology

## 2023-05-22 ENCOUNTER — Ambulatory Visit (HOSPITAL_COMMUNITY): Payer: BC Managed Care – PPO

## 2023-05-22 ENCOUNTER — Observation Stay (HOSPITAL_COMMUNITY)
Admission: RE | Admit: 2023-05-22 | Discharge: 2023-05-23 | Disposition: A | Discharge status: Home / Self Care | Payer: BC Managed Care – PPO | Attending: Orthopedic Surgery | Admitting: Orthopedic Surgery

## 2023-05-22 ENCOUNTER — Ambulatory Visit (HOSPITAL_COMMUNITY)
Admission: RE | Disposition: A | Discharge status: Home / Self Care | Payer: Self-pay | Source: Home / Self Care | Attending: Orthopedic Surgery

## 2023-05-22 DIAGNOSIS — M5117 Intervertebral disc disorders with radiculopathy, lumbosacral region: Principal | ICD-10-CM | POA: Insufficient documentation

## 2023-05-22 DIAGNOSIS — Z79899 Other long term (current) drug therapy: Secondary | ICD-10-CM | POA: Insufficient documentation

## 2023-05-22 DIAGNOSIS — M5416 Radiculopathy, lumbar region: Principal | ICD-10-CM | POA: Diagnosis present

## 2023-05-22 DIAGNOSIS — J45909 Unspecified asthma, uncomplicated: Secondary | ICD-10-CM | POA: Insufficient documentation

## 2023-05-22 DIAGNOSIS — Z87891 Personal history of nicotine dependence: Secondary | ICD-10-CM | POA: Diagnosis not present

## 2023-05-22 HISTORY — PX: TRANSFORAMINAL LUMBAR INTERBODY FUSION (TLIF) WITH PEDICLE SCREW FIXATION 1 LEVEL: SHX6141

## 2023-05-22 LAB — POCT PREGNANCY, URINE: Preg Test, Ur: NEGATIVE

## 2023-05-22 SURGERY — TRANSFORAMINAL LUMBAR INTERBODY FUSION (TLIF) WITH PEDICLE SCREW FIXATION 1 LEVEL
Anesthesia: General | Site: Spine Lumbar | Laterality: Right

## 2023-05-22 MED ORDER — CEFAZOLIN SODIUM-DEXTROSE 2-4 GM/100ML-% IV SOLN
2.0000 g | Freq: Three times a day (TID) | INTRAVENOUS | Status: AC
  Administered 2023-05-22 (×2): 2 g via INTRAVENOUS
  Filled 2023-05-22 (×2): qty 100

## 2023-05-22 MED ORDER — FENTANYL CITRATE (PF) 250 MCG/5ML IJ SOLN
INTRAMUSCULAR | Status: AC
  Filled 2023-05-22: qty 5

## 2023-05-22 MED ORDER — THROMBIN 20000 UNITS EX SOLR
CUTANEOUS | Status: AC
  Filled 2023-05-22: qty 20000

## 2023-05-22 MED ORDER — MORPHINE SULFATE (PF) 2 MG/ML IV SOLN
1.0000 mg | INTRAVENOUS | Status: DC | PRN
  Administered 2023-05-22 (×2): 2 mg via INTRAVENOUS
  Filled 2023-05-22 (×2): qty 1

## 2023-05-22 MED ORDER — ALBUTEROL SULFATE HFA 108 (90 BASE) MCG/ACT IN AERS
INHALATION_SPRAY | RESPIRATORY_TRACT | Status: AC
  Filled 2023-05-22: qty 6.7

## 2023-05-22 MED ORDER — MENTHOL 3 MG MT LOZG: 1.0000 | LOZENGE | OROMUCOSAL | Status: DC | PRN

## 2023-05-22 MED ORDER — FENTANYL CITRATE (PF) 250 MCG/5ML IJ SOLN
INTRAMUSCULAR | Status: DC | PRN
  Administered 2023-05-22: 50 ug via INTRAVENOUS
  Administered 2023-05-22 (×2): 100 ug via INTRAVENOUS

## 2023-05-22 MED ORDER — ONDANSETRON HCL 4 MG/2ML IJ SOLN: 4.0000 mg | Freq: Once | INTRAMUSCULAR | Status: DC | PRN

## 2023-05-22 MED ORDER — ALBUTEROL SULFATE HFA 108 (90 BASE) MCG/ACT IN AERS
INHALATION_SPRAY | RESPIRATORY_TRACT | Status: DC | PRN
  Administered 2023-05-22: 4 via RESPIRATORY_TRACT

## 2023-05-22 MED ORDER — LACTATED RINGERS IV SOLN
INTRAVENOUS | Status: DC | PRN
Start: 2023-05-22 — End: 2023-05-22

## 2023-05-22 MED ORDER — ZOLPIDEM TARTRATE 5 MG PO TABS: 5.0000 mg | ORAL_TABLET | Freq: Every evening | ORAL | Status: DC | PRN

## 2023-05-22 MED ORDER — PHENYLEPHRINE 80 MCG/ML (10ML) SYRINGE FOR IV PUSH (FOR BLOOD PRESSURE SUPPORT)
PREFILLED_SYRINGE | INTRAVENOUS | Status: DC | PRN
  Administered 2023-05-22: 120 ug via INTRAVENOUS
  Administered 2023-05-22: 160 ug via INTRAVENOUS
  Administered 2023-05-22: 80 ug via INTRAVENOUS

## 2023-05-22 MED ORDER — KETAMINE HCL-SODIUM CHLORIDE 100-0.9 MG/10ML-% IV SOSY
PREFILLED_SYRINGE | INTRAVENOUS | Status: DC | PRN
  Administered 2023-05-22: 10 mg via INTRAVENOUS

## 2023-05-22 MED ORDER — PHENOL 1.4 % MT LIQD: 1.0000 | OROMUCOSAL | Status: DC | PRN

## 2023-05-22 MED ORDER — PROPOFOL 10 MG/ML IV BOLUS
INTRAVENOUS | Status: AC
  Filled 2023-05-22: qty 20

## 2023-05-22 MED ORDER — ACETAMINOPHEN 325 MG PO TABS: 650.0000 mg | ORAL_TABLET | ORAL | Status: DC | PRN

## 2023-05-22 MED ORDER — HYDROMORPHONE HCL 1 MG/ML IJ SOLN
0.5000 mg | INTRAMUSCULAR | Status: DC | PRN
  Administered 2023-05-22: 0.5 mg via INTRAVENOUS

## 2023-05-22 MED ORDER — ROCURONIUM BROMIDE 10 MG/ML (PF) SYRINGE
PREFILLED_SYRINGE | INTRAVENOUS | Status: DC | PRN
  Administered 2023-05-22: 20 mg via INTRAVENOUS
  Administered 2023-05-22: 10 mg via INTRAVENOUS
  Administered 2023-05-22: 20 mg via INTRAVENOUS
  Administered 2023-05-22: 70 mg via INTRAVENOUS
  Administered 2023-05-22 (×2): 10 mg via INTRAVENOUS
  Administered 2023-05-22: 20 mg via INTRAVENOUS

## 2023-05-22 MED ORDER — METHOCARBAMOL 500 MG PO TABS
ORAL_TABLET | ORAL | Status: AC
  Administered 2023-05-22: 500 mg via ORAL
  Filled 2023-05-22: qty 1

## 2023-05-22 MED ORDER — DEXAMETHASONE SODIUM PHOSPHATE 10 MG/ML IJ SOLN
INTRAMUSCULAR | Status: DC | PRN
  Administered 2023-05-22: 5 mg via INTRAVENOUS

## 2023-05-22 MED ORDER — OXYCODONE HCL 5 MG PO TABS
ORAL_TABLET | ORAL | Status: AC
  Administered 2023-05-22: 5 mg via ORAL
  Filled 2023-05-22: qty 1

## 2023-05-22 MED ORDER — DEXTROSE 5 % IV SOLN
INTRAVENOUS | Status: DC | PRN
  Administered 2023-05-22: 3 g via INTRAVENOUS

## 2023-05-22 MED ORDER — OXYCODONE HCL ER 10 MG PO T12A
20.0000 mg | EXTENDED_RELEASE_TABLET | Freq: Two times a day (BID) | ORAL | Status: DC
  Administered 2023-05-22: 20 mg via ORAL
  Filled 2023-05-22: qty 2

## 2023-05-22 MED ORDER — LIDOCAINE 2% (20 MG/ML) 5 ML SYRINGE
INTRAMUSCULAR | Status: DC | PRN
  Administered 2023-05-22: 100 mg via INTRAVENOUS

## 2023-05-22 MED ORDER — AMISULPRIDE (ANTIEMETIC) 5 MG/2ML IV SOLN: 10.0000 mg | Freq: Once | INTRAVENOUS | Status: DC | PRN

## 2023-05-22 MED ORDER — BISACODYL 5 MG PO TBEC: 5.0000 mg | DELAYED_RELEASE_TABLET | Freq: Every day | ORAL | Status: DC | PRN

## 2023-05-22 MED ORDER — HYDROMORPHONE HCL 1 MG/ML IJ SOLN
INTRAMUSCULAR | Status: AC
  Filled 2023-05-22: qty 1

## 2023-05-22 MED ORDER — SODIUM CHLORIDE 0.9% FLUSH
3.0000 mL | Freq: Two times a day (BID) | INTRAVENOUS | Status: DC
  Administered 2023-05-22: 3 mL via INTRAVENOUS

## 2023-05-22 MED ORDER — MIDAZOLAM HCL 2 MG/2ML IJ SOLN
INTRAMUSCULAR | Status: AC
  Filled 2023-05-22: qty 2

## 2023-05-22 MED ORDER — ONDANSETRON HCL 4 MG PO TABS: 4.0000 mg | ORAL_TABLET | Freq: Four times a day (QID) | ORAL | Status: DC | PRN

## 2023-05-22 MED ORDER — SODIUM CHLORIDE 0.9 % IV SOLN
250.0000 mL | INTRAVENOUS | Status: DC
  Administered 2023-05-22: 250 mL via INTRAVENOUS

## 2023-05-22 MED ORDER — POVIDONE-IODINE 7.5 % EX SOLN: Freq: Once | CUTANEOUS | Status: AC

## 2023-05-22 MED ORDER — FLEET ENEMA 7-19 GM/118ML RE ENEM: 1.0000 | ENEMA | Freq: Once | RECTAL | Status: DC | PRN

## 2023-05-22 MED ORDER — CEFAZOLIN IN SODIUM CHLORIDE 3-0.9 GM/100ML-% IV SOLN
3.0000 g | INTRAVENOUS | Status: DC
  Filled 2023-05-22: qty 100

## 2023-05-22 MED ORDER — ONDANSETRON HCL 4 MG/2ML IJ SOLN
INTRAMUSCULAR | Status: DC | PRN
  Administered 2023-05-22: 4 mg via INTRAVENOUS

## 2023-05-22 MED ORDER — BUPIVACAINE-EPINEPHRINE 0.25% -1:200000 IJ SOLN
INTRAMUSCULAR | Status: DC | PRN
  Administered 2023-05-22: 10 mL

## 2023-05-22 MED ORDER — PROPOFOL 10 MG/ML IV BOLUS
INTRAVENOUS | Status: DC | PRN
Start: 2023-05-22 — End: 2023-05-22
  Administered 2023-05-22: 25 ug/kg/min via INTRAVENOUS
  Administered 2023-05-22: 270 mg via INTRAVENOUS

## 2023-05-22 MED ORDER — MINERAL OIL LIGHT 100 % EX OIL
TOPICAL_OIL | CUTANEOUS | Status: DC | PRN
  Administered 2023-05-22: 1 via TOPICAL

## 2023-05-22 MED ORDER — HYDROCODONE-ACETAMINOPHEN 5-325 MG PO TABS: 1.0000 | ORAL_TABLET | ORAL | Status: DC | PRN

## 2023-05-22 MED ORDER — POTASSIUM CHLORIDE IN NACL 20-0.9 MEQ/L-% IV SOLN: INTRAVENOUS | Status: DC

## 2023-05-22 MED ORDER — SUGAMMADEX SODIUM 200 MG/2ML IV SOLN
INTRAVENOUS | Status: DC | PRN
  Administered 2023-05-22: 200 mg via INTRAVENOUS

## 2023-05-22 MED ORDER — ONDANSETRON HCL 4 MG/2ML IJ SOLN: 4.0000 mg | Freq: Four times a day (QID) | INTRAMUSCULAR | Status: DC | PRN

## 2023-05-22 MED ORDER — KETAMINE HCL 50 MG/5ML IJ SOSY
PREFILLED_SYRINGE | INTRAMUSCULAR | Status: AC
  Filled 2023-05-22: qty 5

## 2023-05-22 MED ORDER — ONDANSETRON 4 MG PO TBDP: 4.0000 mg | ORAL_TABLET | Freq: Three times a day (TID) | ORAL | Status: DC | PRN

## 2023-05-22 MED ORDER — CHLORHEXIDINE GLUCONATE 0.12 % MT SOLN
15.0000 mL | Freq: Once | OROMUCOSAL | Status: AC
  Administered 2023-05-22: 15 mL via OROMUCOSAL
  Filled 2023-05-22: qty 15

## 2023-05-22 MED ORDER — LACTATED RINGERS IV SOLN: INTRAVENOUS | Status: DC

## 2023-05-22 MED ORDER — ALUM & MAG HYDROXIDE-SIMETH 200-200-20 MG/5ML PO SUSP: 30.0000 mL | Freq: Four times a day (QID) | ORAL | Status: DC | PRN

## 2023-05-22 MED ORDER — THROMBIN 20000 UNITS EX KIT
PACK | CUTANEOUS | Status: DC | PRN
  Administered 2023-05-22: 20 mL via TOPICAL

## 2023-05-22 MED ORDER — 0.9 % SODIUM CHLORIDE (POUR BTL) OPTIME
TOPICAL | Status: DC | PRN
  Administered 2023-05-22 (×2): 1000 mL

## 2023-05-22 MED ORDER — BUPIVACAINE-EPINEPHRINE (PF) 0.25% -1:200000 IJ SOLN
INTRAMUSCULAR | Status: AC
  Filled 2023-05-22: qty 30

## 2023-05-22 MED ORDER — SCOPOLAMINE 1 MG/3DAYS TD PT72
1.0000 | MEDICATED_PATCH | Freq: Once | TRANSDERMAL | Status: DC
  Administered 2023-05-22: 1.5 mg via TRANSDERMAL
  Filled 2023-05-22: qty 1

## 2023-05-22 MED ORDER — MIDAZOLAM HCL 2 MG/2ML IJ SOLN
INTRAMUSCULAR | Status: DC | PRN
  Administered 2023-05-22 (×2): 2 mg via INTRAVENOUS

## 2023-05-22 MED ORDER — OXYCODONE-ACETAMINOPHEN 5-325 MG PO TABS
1.0000 | ORAL_TABLET | ORAL | Status: DC | PRN
  Administered 2023-05-22 – 2023-05-23 (×4): 2 via ORAL
  Filled 2023-05-22 (×4): qty 2

## 2023-05-22 MED ORDER — BUPIVACAINE-EPINEPHRINE (PF) 0.25% -1:200000 IJ SOLN
INTRAMUSCULAR | Status: DC | PRN
  Administered 2023-05-22: 40 mL

## 2023-05-22 MED ORDER — SODIUM CHLORIDE 0.9% FLUSH: 3.0000 mL | INTRAVENOUS | Status: DC | PRN

## 2023-05-22 MED ORDER — OXYCODONE HCL 5 MG PO TABS: 5.0000 mg | ORAL_TABLET | Freq: Once | ORAL | Status: AC

## 2023-05-22 MED ORDER — FENTANYL CITRATE (PF) 100 MCG/2ML IJ SOLN: 25.0000 ug | INTRAMUSCULAR | Status: DC | PRN

## 2023-05-22 MED ORDER — ACETAMINOPHEN 500 MG PO TABS
1000.0000 mg | ORAL_TABLET | Freq: Once | ORAL | Status: AC
  Administered 2023-05-22: 1000 mg via ORAL
  Filled 2023-05-22: qty 2

## 2023-05-22 MED ORDER — LORAZEPAM 0.5 MG PO TABS
1.0000 mg | ORAL_TABLET | ORAL | Status: DC | PRN
  Administered 2023-05-22 – 2023-05-23 (×4): 1 mg via ORAL
  Filled 2023-05-22 (×4): qty 2

## 2023-05-22 MED ORDER — SENNOSIDES-DOCUSATE SODIUM 8.6-50 MG PO TABS: 1.0000 | ORAL_TABLET | Freq: Every evening | ORAL | Status: DC | PRN

## 2023-05-22 MED ORDER — METHOCARBAMOL 1000 MG/10ML IJ SOLN: 500.0000 mg | Freq: Four times a day (QID) | INTRAVENOUS | Status: DC | PRN

## 2023-05-22 MED ORDER — BUPIVACAINE LIPOSOME 1.3 % IJ SUSP
INTRAMUSCULAR | Status: AC
  Filled 2023-05-22: qty 20

## 2023-05-22 MED ORDER — ORAL CARE MOUTH RINSE: 15.0000 mL | Freq: Once | OROMUCOSAL | Status: AC

## 2023-05-22 MED ORDER — ACETAMINOPHEN 650 MG RE SUPP: 650.0000 mg | RECTAL | Status: DC | PRN

## 2023-05-22 MED ORDER — MINERAL OIL LIGHT OIL
TOPICAL_OIL | Status: DC
  Filled 2023-05-22: qty 10

## 2023-05-22 MED ORDER — METHOCARBAMOL 500 MG PO TABS
500.0000 mg | ORAL_TABLET | Freq: Four times a day (QID) | ORAL | Status: DC | PRN
  Administered 2023-05-22: 1000 mg via ORAL
  Filled 2023-05-22: qty 2

## 2023-05-22 MED ORDER — DOCUSATE SODIUM 100 MG PO CAPS
100.0000 mg | ORAL_CAPSULE | Freq: Two times a day (BID) | ORAL | Status: DC
  Administered 2023-05-22: 100 mg via ORAL
  Filled 2023-05-22: qty 1

## 2023-05-22 SURGICAL SUPPLY — 97 items
AGENT HMST KT MTR STRL THRMB (HEMOSTASIS)
APL SKNCLS STERI-STRIP NONHPOA (GAUZE/BANDAGES/DRESSINGS) ×1
BAG COUNTER SPONGE SURGICOUNT (BAG) ×1 IMPLANT
BAG SPNG CNTER NS LX DISP (BAG) ×1
BENZOIN TINCTURE PRP APPL 2/3 (GAUZE/BANDAGES/DRESSINGS) ×1 IMPLANT
BLADE CLIPPER SURG (BLADE) IMPLANT
BUR PRESCISION 1.7 ELITE (BURR) ×1 IMPLANT
BUR ROUND FLUTED 5 RND (BURR) ×1 IMPLANT
BUR ROUND PRECISION 4.0 (BURR) IMPLANT
BUR SABER RD CUTTING 3.0 (BURR) IMPLANT
CAGE SABLE 10X22 6-12 8D (Cage) IMPLANT
CANNULA GRAFT BNE VG PRE-FILL (Bone Implant) IMPLANT
CNTNR URN SCR LID CUP LEK RST (MISCELLANEOUS) ×2 IMPLANT
CONT SPEC 4OZ STRL OR WHT (MISCELLANEOUS) ×1
COVER BACK TABLE 60X90IN (DRAPES) ×2 IMPLANT
COVER MAYO STAND STRL (DRAPES) ×2 IMPLANT
COVER SURGICAL LIGHT HANDLE (MISCELLANEOUS) ×2 IMPLANT
DISPENSER GRAFT BNE VG (MISCELLANEOUS) IMPLANT
DISPENSER VIVIGEN BONE GRAFT (MISCELLANEOUS) ×1
DRAIN CHANNEL 15F RND FF W/TCR (WOUND CARE) IMPLANT
DRAPE C-ARM 42X72 X-RAY (DRAPES) ×1 IMPLANT
DRAPE C-ARMOR (DRAPES) IMPLANT
DRAPE POUCH INSTRU U-SHP 10X18 (DRAPES) ×1 IMPLANT
DRAPE SURG 17X23 STRL (DRAPES) ×4 IMPLANT
DURAPREP 26ML APPLICATOR (WOUND CARE) ×1 IMPLANT
ELECT BLADE 4.0 EZ CLEAN MEGAD (MISCELLANEOUS) ×1
ELECT CAUTERY BLADE 6.4 (BLADE) ×1 IMPLANT
ELECT REM PT RETURN 9FT ADLT (ELECTROSURGICAL) ×1
ELECTRODE BLDE 4.0 EZ CLN MEGD (MISCELLANEOUS) ×1 IMPLANT
ELECTRODE REM PT RTRN 9FT ADLT (ELECTROSURGICAL) ×1 IMPLANT
EVACUATOR SILICONE 100CC (DRAIN) IMPLANT
FILTER STRAW FLUID ASPIR (MISCELLANEOUS) ×2 IMPLANT
GAUZE 4X4 16PLY ~~LOC~~+RFID DBL (SPONGE) ×2 IMPLANT
GAUZE SPONGE 4X4 12PLY STRL (GAUZE/BANDAGES/DRESSINGS) ×1 IMPLANT
GLOVE BIO SURGEON STRL SZ 6.5 (GLOVE) ×1 IMPLANT
GLOVE BIO SURGEON STRL SZ8 (GLOVE) ×2 IMPLANT
GLOVE BIOGEL PI IND STRL 7.0 (GLOVE) ×1 IMPLANT
GLOVE BIOGEL PI IND STRL 8 (GLOVE) ×1 IMPLANT
GLOVE SURG ENC MOIS LTX SZ6.5 (GLOVE) ×1 IMPLANT
GOWN STRL REUS W/ TWL LRG LVL3 (GOWN DISPOSABLE) ×2 IMPLANT
GOWN STRL REUS W/ TWL XL LVL3 (GOWN DISPOSABLE) ×1 IMPLANT
GOWN STRL REUS W/TWL LRG LVL3 (GOWN DISPOSABLE) ×2
GOWN STRL REUS W/TWL XL LVL3 (GOWN DISPOSABLE) ×1
GRAFT BONE CANNULA VIVIGEN 3 (Bone Implant) ×2 IMPLANT
IV CATH 14GX2 1/4 (CATHETERS) ×1 IMPLANT
KIT BASIN OR (CUSTOM PROCEDURE TRAY) ×1 IMPLANT
KIT POSITION SURG JACKSON T1 (MISCELLANEOUS) ×1 IMPLANT
KIT TURNOVER KIT B (KITS) ×1 IMPLANT
MARKER SKIN DUAL TIP RULER LAB (MISCELLANEOUS) ×2 IMPLANT
NDL 18GX1X1/2 (RX/OR ONLY) (NEEDLE) ×1 IMPLANT
NDL 22X1.5 STRL (OR ONLY) (MISCELLANEOUS) ×2 IMPLANT
NDL HYPO 25GX1X1/2 BEV (NEEDLE) ×1 IMPLANT
NDL SPNL 18GX3.5 QUINCKE PK (NEEDLE) ×2 IMPLANT
NEEDLE 18GX1X1/2 (RX/OR ONLY) (NEEDLE) ×1
NEEDLE 22X1.5 STRL (OR ONLY) (MISCELLANEOUS) ×2
NEEDLE HYPO 25GX1X1/2 BEV (NEEDLE) ×1
NEEDLE SPNL 18GX3.5 QUINCKE PK (NEEDLE) ×2
NS IRRIG 1000ML POUR BTL (IV SOLUTION) ×1 IMPLANT
PACK LAMINECTOMY ORTHO (CUSTOM PROCEDURE TRAY) ×1 IMPLANT
PACK UNIVERSAL I (CUSTOM PROCEDURE TRAY) ×1 IMPLANT
PAD ARMBOARD 7.5X6 YLW CONV (MISCELLANEOUS) ×2 IMPLANT
PATTIES SURGICAL .5 X1 (DISPOSABLE) ×1 IMPLANT
PATTIES SURGICAL .5X1.5 (GAUZE/BANDAGES/DRESSINGS) ×2 IMPLANT
PUTTY DBX 1CC (Putty) ×1 IMPLANT
PUTTY DBX 1CC DEPUY (Putty) IMPLANT
ROD PRE BENT EXP 40MM (Rod) IMPLANT
ROD PRE LORDOSED 5.5X45 (Rod) IMPLANT
SCREW CORTICAL VIPER 7X35 (Screw) IMPLANT
SCREW SET SINGLE INNER (Screw) IMPLANT
SCREW VIPER CORT FIX 6.00X30 (Screw) IMPLANT
SCREW VIPER CORT FIX 6X35 (Screw) IMPLANT
SPONGE INTESTINAL PEANUT (DISPOSABLE) ×1 IMPLANT
SPONGE SURGIFOAM ABS GEL 100 (HEMOSTASIS) ×1 IMPLANT
STRIP CLOSURE SKIN 1/2X4 (GAUZE/BANDAGES/DRESSINGS) ×2 IMPLANT
SURGIFLO W/THROMBIN 8M KIT (HEMOSTASIS) IMPLANT
SUT BONE WAX W31G (SUTURE) IMPLANT
SUT MNCRL AB 4-0 PS2 18 (SUTURE) ×1 IMPLANT
SUT VIC AB 0 CT1 18XCR BRD 8 (SUTURE) ×1 IMPLANT
SUT VIC AB 0 CT1 8-18 (SUTURE) ×1
SUT VIC AB 1 CT1 18XCR BRD 8 (SUTURE) ×1 IMPLANT
SUT VIC AB 1 CT1 8-18 (SUTURE) ×1
SUT VIC AB 2-0 CT1 18 (SUTURE) IMPLANT
SUT VIC AB 2-0 CT2 18 VCP726D (SUTURE) ×1 IMPLANT
SYR 20ML LL LF (SYRINGE) ×2 IMPLANT
SYR BULB IRRIG 60ML STRL (SYRINGE) ×1 IMPLANT
SYR CONTROL 10ML LL (SYRINGE) ×4 IMPLANT
SYR TB 1ML LUER SLIP (SYRINGE) ×1 IMPLANT
TAP EXPEDIUM DL 5.0 (INSTRUMENTS) IMPLANT
TAP EXPEDIUM DL 6.0 (INSTRUMENTS) IMPLANT
TAP EXPEDIUM DL 7.0 (INSTRUMENTS) ×1
TAP EXPEDIUM DL 7X2 (INSTRUMENTS) IMPLANT
TAPE CLOTH SURG 4X10 WHT LF (GAUZE/BANDAGES/DRESSINGS) IMPLANT
TRAY FOLEY MTR SLVR 14FR STAT (SET/KITS/TRAYS/PACK) IMPLANT
TRAY FOLEY MTR SLVR 16FR STAT (SET/KITS/TRAYS/PACK) ×1 IMPLANT
TUBE FUNNEL GL DISP (ORTHOPEDIC DISPOSABLE SUPPLIES) IMPLANT
WATER STERILE IRR 1000ML POUR (IV SOLUTION) ×1 IMPLANT
YANKAUER SUCT BULB TIP NO VENT (SUCTIONS) ×1 IMPLANT

## 2023-05-22 NOTE — Plan of Care (Signed)
Problem: Education: Goal: Ability to verbalize activity precautions or restrictions will improve 05/22/2023 2015 by Yvone Neu, RN Outcome: Progressing 05/22/2023 2015 by Yvone Neu, RN Outcome: Progressing Goal: Knowledge of the prescribed therapeutic regimen will improve 05/22/2023 2015 by Yvone Neu, RN Outcome: Progressing 05/22/2023 2015 by Yvone Neu, RN Outcome: Progressing Goal: Understanding of discharge needs will improve 05/22/2023 2015 by Yvone Neu, RN Outcome: Progressing 05/22/2023 2015 by Yvone Neu, RN Outcome: Progressing   Problem: Activity: Goal: Ability to avoid complications of mobility impairment will improve 05/22/2023 2015 by Yvone Neu, RN Outcome: Progressing 05/22/2023 2015 by Yvone Neu, RN Outcome: Progressing Goal: Ability to tolerate increased activity will improve 05/22/2023 2015 by Yvone Neu, RN Outcome: Progressing 05/22/2023 2015 by Yvone Neu, RN Outcome: Progressing Goal: Will remain free from falls 05/22/2023 2015 by Yvone Neu, RN Outcome: Progressing 05/22/2023 2015 by Yvone Neu, RN Outcome: Progressing   Problem: Bowel/Gastric: Goal: Gastrointestinal status for postoperative course will improve 05/22/2023 2015 by Yvone Neu, RN Outcome: Progressing 05/22/2023 2015 by Yvone Neu, RN Outcome: Progressing   Problem: Clinical Measurements: Goal: Ability to maintain clinical measurements within normal limits will improve 05/22/2023 2015 by Yvone Neu, RN Outcome: Progressing 05/22/2023 2015 by Yvone Neu, RN Outcome: Progressing Goal: Postoperative complications will be avoided or minimized 05/22/2023 2015 by Yvone Neu, RN Outcome: Progressing 05/22/2023 2015 by Yvone Neu, RN Outcome: Progressing Goal: Diagnostic test results will improve 05/22/2023 2015 by Yvone Neu, RN Outcome: Progressing 05/22/2023 2015 by Yvone Neu, RN Outcome:  Progressing   Problem: Pain Management: Goal: Pain level will decrease 05/22/2023 2015 by Yvone Neu, RN Outcome: Progressing 05/22/2023 2015 by Yvone Neu, RN Outcome: Progressing   Problem: Skin Integrity: Goal: Will show signs of wound healing 05/22/2023 2015 by Yvone Neu, RN Outcome: Progressing 05/22/2023 2015 by Yvone Neu, RN Outcome: Progressing   Problem: Health Behavior/Discharge Planning: Goal: Identification of resources available to assist in meeting health care needs will improve 05/22/2023 2015 by Yvone Neu, RN Outcome: Progressing 05/22/2023 2015 by Yvone Neu, RN Outcome: Progressing   Problem: Bladder/Genitourinary: Goal: Urinary functional status for postoperative course will improve 05/22/2023 2015 by Yvone Neu, RN Outcome: Progressing 05/22/2023 2015 by Yvone Neu, RN Outcome: Progressing   Problem: Education: Goal: Knowledge of General Education information will improve Description: Including pain rating scale, medication(s)/side effects and non-pharmacologic comfort measures 05/22/2023 2015 by Yvone Neu, RN Outcome: Progressing 05/22/2023 2015 by Yvone Neu, RN Outcome: Progressing   Problem: Health Behavior/Discharge Planning: Goal: Ability to manage health-related needs will improve 05/22/2023 2015 by Yvone Neu, RN Outcome: Progressing 05/22/2023 2015 by Yvone Neu, RN Outcome: Progressing   Problem: Clinical Measurements: Goal: Ability to maintain clinical measurements within normal limits will improve 05/22/2023 2015 by Yvone Neu, RN Outcome: Progressing 05/22/2023 2015 by Yvone Neu, RN Outcome: Progressing Goal: Will remain free from infection 05/22/2023 2015 by Yvone Neu, RN Outcome: Progressing 05/22/2023 2015 by Yvone Neu, RN Outcome: Progressing Goal: Diagnostic test results will improve 05/22/2023 2015 by Yvone Neu, RN Outcome: Progressing 05/22/2023  2015 by Yvone Neu, RN Outcome: Progressing Goal: Respiratory complications will improve 05/22/2023 2015 by Yvone Neu, RN Outcome: Progressing 05/22/2023 2015 by Yvone Neu, RN Outcome: Progressing Goal: Cardiovascular complication will be avoided 05/22/2023 2015 by Yvone Neu, RN Outcome: Progressing  05/22/2023 2015 by Yvone Neu, RN Outcome: Progressing   Problem: Activity: Goal: Risk for activity intolerance will decrease 05/22/2023 2015 by Yvone Neu, RN Outcome: Progressing 05/22/2023 2015 by Yvone Neu, RN Outcome: Progressing   Problem: Nutrition: Goal: Adequate nutrition will be maintained 05/22/2023 2015 by Yvone Neu, RN Outcome: Progressing 05/22/2023 2015 by Yvone Neu, RN Outcome: Progressing   Problem: Coping: Goal: Level of anxiety will decrease 05/22/2023 2015 by Yvone Neu, RN Outcome: Progressing 05/22/2023 2015 by Yvone Neu, RN Outcome: Progressing   Problem: Elimination: Goal: Will not experience complications related to bowel motility 05/22/2023 2015 by Yvone Neu, RN Outcome: Progressing 05/22/2023 2015 by Yvone Neu, RN Outcome: Progressing Goal: Will not experience complications related to urinary retention 05/22/2023 2015 by Yvone Neu, RN Outcome: Progressing 05/22/2023 2015 by Yvone Neu, RN Outcome: Progressing   Problem: Pain Managment: Goal: General experience of comfort will improve 05/22/2023 2015 by Yvone Neu, RN Outcome: Progressing 05/22/2023 2015 by Yvone Neu, RN Outcome: Progressing   Problem: Safety: Goal: Ability to remain free from injury will improve 05/22/2023 2015 by Yvone Neu, RN Outcome: Progressing 05/22/2023 2015 by Yvone Neu, RN Outcome: Progressing   Problem: Skin Integrity: Goal: Risk for impaired skin integrity will decrease 05/22/2023 2015 by Yvone Neu, RN Outcome: Progressing 05/22/2023 2015 by Yvone Neu,  RN Outcome: Progressing

## 2023-05-22 NOTE — Progress Notes (Signed)
Patient ID: Cassie Blake, female   DOB: 05/29/1991, 32 y.o.   MRN: [id number]  Patient c/o 10/10 pain. MD aware. See additional orders.  Lidia Collum, RN

## 2023-05-22 NOTE — Plan of Care (Signed)

## 2023-05-22 NOTE — Transfer of Care (Signed)
Immediate Anesthesia Transfer of Care Note  Patient: Cassie Blake  Procedure(s) Performed: RIGHT-SIDED LUMBAR 5 - SACRUM 1 TRANSFORAMINAL LUMBAR INTERBODY FUSION AND DECOMPRESSION WITH INSTRUMENTATION AND ALLOGRAFT (Right: Spine Lumbar)  Patient Location: PACU  Anesthesia Type:General  Level of Consciousness: awake  Airway & Oxygen Therapy: Patient connected to face mask oxygen  Post-op Assessment: Report given to RN, Post -op Vital signs reviewed and stable, Patient moving all extremities, and Patient able to stick tongue midline  Post vital signs: stable  Last Vitals:  Vitals Value Taken Time  BP 118/72 05/22/23 1325  Temp    Pulse 85 05/22/23 1332  Resp 18 05/22/23 1332  SpO2 96 % 05/22/23 1332  Vitals shown include unfiled device data.  Last Pain:  Vitals:   05/22/23 0714  TempSrc: Oral  PainSc:       Patients Stated Pain Goal: 2 (05/22/23 0703)  Complications:  Encounter Notable Events  Notable Event Outcome Phase Comment  Difficult to intubate - expected  Intraprocedure Filed from anesthesia note documentation.

## 2023-05-22 NOTE — Anesthesia Procedure Notes (Signed)
Procedure Name: Intubation Date/Time: 05/22/2023 9:30 AM  Performed by: Aris Georgia, CRNAPre-anesthesia Checklist: Patient identified, Suction available, Patient being monitored, Timeout performed and Emergency Drugs available Patient Re-evaluated:Patient Re-evaluated prior to induction Preoxygenation: Pre-oxygenation with 100% oxygen Induction Type: IV induction Ventilation: Two handed mask ventilation required Laryngoscope Size: Glidescope and 3 Grade View: Grade II Tube type: Oral Tube size: 7.5 mm Number of attempts: 1 Airway Equipment and Method: Stylet Placement Confirmation: ETT inserted through vocal cords under direct vision, breath sounds checked- equal and bilateral and positive ETCO2 Secured at: 23 cm Tube secured with: Tape Dental Injury: Teeth and Oropharynx as per pre-operative assessment  Difficulty Due To: Difficulty was anticipated

## 2023-05-22 NOTE — H&P (Signed)
PREOPERATIVE H&P  Chief Complaint: Right leg pain  HPI: Cassie Blake is a 32 y.o. female who presents with ongoing pain in the right leg  MRI reveals a recurrent disc herniation at L5/S1 on the right  Patient has failed multiple forms of conservative care and continues to have pain (see office notes for additional details regarding the patient's full course of treatment)  Past Medical History:  Diagnosis Date   Allergy    Anxiety    Asthma    Clostridium difficile infection    being treated currently   Complication of anesthesia    Constipation, chronic    Depression    GERD (gastroesophageal reflux disease)    H/O drug abuse (HCC)    Impetigo    Polycystic ovary syndrome    PONV (postoperative nausea and vomiting)    Past Surgical History:  Procedure Laterality Date   CYSTOSCOPY/URETEROSCOPY/HOLMIUM LASER/STENT PLACEMENT Right 05/28/2015   Procedure: CYSTOSCOPY/RIGHT RETROGRADE PYELOGRAM/ RIGHT URETEROSCOPY/STONE BASKETRY/HOLMIUM LASER APPLICATION;  Surgeon: Heloise Purpura, MD;  Location: [facility];  Service: Urology;  Laterality: Right;   MICRODISCECTOMY LUMBAR     PILONIDAL CYST DRAINAGE     Social History   Socioeconomic History   Marital status: Single    Spouse name: Not on file   Number of children: Not on file   Years of education: Not on file   Highest education level: Not on file  Occupational History   Not on file  Tobacco Use   Smoking status: Former    Current packs/day: 0.00    Average packs/day: 0.3 packs/day for 11.0 years (2.8 ttl pk-yrs)    Types: Cigarettes    Quit date: 02/26/2023    Years since quitting: 0.2   Smokeless tobacco: Never  Vaping Use   Vaping status: Some Days  Substance and Sexual Activity   Alcohol use: Yes    Alcohol/week: 2.0 standard drinks of alcohol    Types: 2 Glasses of wine per week    Comment: socially   Drug use: Yes    Types: Marijuana    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   Sexual  activity: Not on file  Other Topics Concern   Not on file  Social History Narrative   Not on file   Social Determinants of Health   Financial Resource Strain: Not on file  Food Insecurity: Not on file  Transportation Needs: Not on file  Physical Activity: Not on file  Stress: Not on file  Social Connections: Not on file   History reviewed. No pertinent family history. No Known Allergies Prior to Admission medications   Medication Sig Start Date End Date Taking? Authorizing Provider  HYDROcodone-acetaminophen (NORCO/VICODIN) 5-325 MG tablet Take 1 tablet by mouth every 4 (four) hours as needed for moderate pain. 04/25/23  Yes [provider]  ibuprofen (ADVIL,MOTRIN) 200 MG tablet Take 400-800 mg by mouth every 4 (four) hours as needed for fever, headache, mild pain, moderate pain or cramping.   Yes [provider]  methocarbamol (ROBAXIN) 500 MG tablet Take 500-1,000 mg by mouth every 6 (six) hours as needed for muscle spasms. 03/13/23  Yes [provider]  albuterol (PROVENTIL HFA;VENTOLIN HFA) 108 (90 BASE) MCG/ACT inhaler Inhale 1-2 puffs into the lungs every 6 (six) hours as needed for wheezing. Patient not taking: Reported on 05/14/2023 05/28/15 01/10/17  Heloise Purpura, MD  Dextromethorphan-Guaifenesin [facility] DM MAXIMUM STRENGTH) 60-1200 MG TB12 Take 1 tablet by mouth every 12 (twelve) hours. Patient  not taking: Reported on 05/14/2023 03/06/17   McVey, Madelaine Bhat, PA-C  lidocaine (LIDODERM) 5 % Place 1 patch onto the skin daily. Remove & Discard patch within 12 hours or as directed by MD Patient not taking: Reported on 05/14/2023 03/06/17   McVey, Madelaine Bhat, PA-C  ondansetron (ZOFRAN ODT) 4 MG disintegrating tablet 4mg  ODT q4 hours prn nausea/vomit Patient not taking: Reported on 05/14/2023 04/30/21   Melene Plan, DO  predniSONE (DELTASONE) 20 MG tablet Take 2 tablets (40 mg total) by mouth daily. Patient not taking: Reported on 05/14/2023 09/05/20    Benjiman Core, MD     All other systems have been reviewed and were otherwise negative with the exception of those mentioned in the HPI and as above.  Physical Exam: Vitals:   05/22/23 0714  BP: (!) 134/91  Pulse: 80  Resp: 18  Temp: 98.1 F (36.7 C)  SpO2: 100%    Body mass index is 45.19 kg/m.  General: Alert, no acute distress Cardiovascular: No pedal edema Respiratory: No cyanosis, no use of accessory musculature Skin: No lesions in the area of chief complaint Neurologic: Sensation intact distally Psychiatric: Patient is competent for consent with normal mood and affect Lymphatic: No axillary or cervical lymphadenopathy   Assessment/Plan: DEGENERATIVE DISC DISEASE, RECURRENT L5/S1 DISC HERNIATION Plan for Procedure(s): RIGHT-SIDED LUMBAR 5 - SACRUM 1 TRANSFORAMINAL LUMBAR INTERBODY FUSION AND DECOMPRESSION WITH INSTRUMENTATION AND ALLOGRAFT   Jackelyn Hoehn, MD 05/22/2023 7:47 AM

## 2023-05-22 NOTE — Op Note (Signed)
PATIENT NAME: Cassie Blake   MEDICAL RECORD NO.:   [id number]   DATE OF BIRTH: April 13, 1991   DATE OF PROCEDURE: 05/22/2023                               OPERATIVE REPORT   PREOPERATIVE DIAGNOSES: 1. Right-sided lumbar radiculopathy 2. Severe L5-S1 degenerative disc disease 3. Status post previous L5-S1 microdiscectomy, with a recurrent disc herniation occurring after a fall   POSTOPERATIVE DIAGNOSES: 1. Right-sided lumbar radiculopathy 2. Severe L5-S1 degenerative disc disease 3. Status post previous L5-S1 microdiscectomy, with a recurrent disc herniation occurring after a fall   PROCEDURES: 1. Right-sided L5-S1 transforaminal lumbar interbody fusion. 2. Left-sided L5-S1 posterolateral fusion. 3. Revision decompression L5/S1, with removal of disc herniation 4. Insertion of interbody device x1 (Globus expandable intervertebral spacer). 5. Placement of posterior instrumentation at L5, S1 bilaterally. 6. Use of local autograft. 7. Use of morselized allograft - ViviGen. 8. Intraoperative use of fluoroscopy.   SURGEON:  Estill Bamberg, MD.   ASSISTANTJason Coop, PA-C.   ANESTHESIA:  General endotracheal anesthesia.   COMPLICATIONS:  None.   DISPOSITION:  Stable.   ESTIMATED BLOOD LOSS:   Minimal   INDICATIONS FOR SURGERY:  Briefly, Cassie Blake is a pleasant 32 year old female who did present to me with severe and ongoing pain in the left leg.  She previously had a right-sided L5-S1 microdiscectomy.  She did very well from the surgery, with resolution of her preoperative pain.  Subsequent to this however, she did sustain a fall, with recurrent pain in her right leg.  An updated MRI did reveal severe degeneration at L5-S1, with a recurrent disc herniation.  Given her ongoing and severe right leg pain, we did discuss proceeding with the surgery noted above. We did have an extensive discussion about surgery, and she did elect to proceed with the procedure noted above.      OPERATIVE  DETAILS:  On 05/22/2023, the patient was brought to surgery and general endotracheal anesthesia was administered.  The patient was placed prone on a well-padded flat Jackson bed with a spinal frame.  Antibiotics were given and a time-out procedure was performed. The back was prepped and draped in the usual fashion.  A midline incision was made overlying the L5-S1 intervertebral space.  The fascia was incised at the midline.  The paraspinal musculature was bluntly swept laterally.  Anatomic landmarks for the pedicles were exposed. Using fluoroscopy, I did cannulate the L5 and S1 pedicles bilaterally, using a medial to lateral cortical trajectory technique.  On the left side, the posterolateral gutter and right facet joint at L5/S1 was decorticated and 6 x 40 mm screws were placed and a 40-mm rod was placed and distraction was applied across the rod on the left side.  On the right side, the cannulated pedicle holes were filled with bone wax.  I then proceeded with the decompressive aspect of the procedure.  On the right side, a full facetectomy was performed.  There was abundant scar and granulation tissue noted in the region of the right lateral recess, in the region of the traversing right S1 nerve.  I was able to meticulously develop a plane between the traversing nerve and the disc herniation immediately ventral to it.  This was quite time-consuming, given the significant adhesions noted.  This portion of the procedure took approximately 40 minutes, whereas normally it would take about 5.  I was however able to  develop a safe plane, after which point, the traversing right S1 nerve was gently medially retracted. With an assistant holding medial retraction of the traversing right S1 nerve, I did perform an annulotomy at the posterolateral aspect of the L5/S1 intervertebral space.  I then used a series of curettes and pituitary rongeurs to perform a thorough and complete intervertebral diskectomy.   This, in combination with the facetectomy, did result in complete decompression of the right lateral recess at L5-S1.  At this point, the intervertebral space was  liberally packed with autograft as well as allograft in the form of ViviGen, as was the appropriate-sized intervertebral spacer.  The spacer was then tamped into position in the usual fashion, and expanded to approximately 8.55 mm in height.  I was very pleased with the press-fit of the spacer.  I then placed 6 x 35 mm screws on the right at L5 and S1.  A 40-mm rod was then placed and caps were placed. The distraction was then released on the contralateral left side.  All 4 caps were then locked.  The wound was copiously irrigated with a total of approximately 3 L prior to placing the bone graft.  Additional autograft and allograft were then packed into the posterolateral gutter on the left side to help aid in the L5-S1 fusion.  The wound was explored for any undue bleeding and there was no substantial bleeding encountered.  Gel-Foam was placed over the laminectomy site.  The wound was then closed in layers using #1 Vicryl followed by 2-0 Vicryl, followed by 4-0 Monocryl.  Benzoin and Steri-Strips were applied followed by sterile dressing.    Of note, Jason Coop was my assistant throughout surgery, and did aid in retraction, suctioning, and closure.     Estill Bamberg, MD

## 2023-05-23 DIAGNOSIS — M5117 Intervertebral disc disorders with radiculopathy, lumbosacral region: Secondary | ICD-10-CM | POA: Diagnosis not present

## 2023-05-23 MED ORDER — OXYCODONE HCL ER 20 MG PO T12A: 20.0000 mg | EXTENDED_RELEASE_TABLET | Freq: Two times a day (BID) | ORAL | 0 refills | Status: AC

## 2023-05-23 MED ORDER — METHOCARBAMOL 500 MG PO TABS: 500.0000 mg | ORAL_TABLET | Freq: Four times a day (QID) | ORAL | 2 refills | Status: AC | PRN

## 2023-05-23 MED ORDER — OXYCODONE-ACETAMINOPHEN 5-325 MG PO TABS: 1.0000 | ORAL_TABLET | ORAL | 0 refills | Status: AC | PRN

## 2023-05-23 MED FILL — Thrombin For Soln 20000 Unit: CUTANEOUS | Qty: 1 | Status: AC

## 2023-05-23 NOTE — Progress Notes (Signed)
DC instructions reviewed with patient. Patient understanding and will follow up with Dr. Yevette Edwards in two weeks. All belongings at bedside sent home with patient. Patient escorted to vehicle by staff.

## 2023-05-23 NOTE — Anesthesia Postprocedure Evaluation (Signed)
Anesthesia Post Note  Patient: Biomedical scientist  Procedure(s) Performed: RIGHT-SIDED LUMBAR 5 - SACRUM 1 TRANSFORAMINAL LUMBAR INTERBODY FUSION AND DECOMPRESSION WITH INSTRUMENTATION AND ALLOGRAFT (Right: Spine Lumbar)     Patient location during evaluation: PACU Anesthesia Type: General Level of consciousness: awake and alert Pain management: pain level controlled Vital Signs Assessment: post-procedure vital signs reviewed and stable Respiratory status: spontaneous breathing, nonlabored ventilation, respiratory function stable and patient connected to nasal cannula oxygen Cardiovascular status: blood pressure returned to baseline and stable Postop Assessment: no apparent nausea or vomiting Anesthetic complications: yes   Encounter Notable Events  Notable Event Outcome Phase Comment  Difficult to intubate - expected  Intraprocedure Filed from anesthesia note documentation.    Last Vitals:  Vitals:   05/23/23 0308 05/23/23 0754  BP: (!) 144/92 (!) 128/92  Pulse: 82 82  Resp: 18 18  Temp: 36.8 C 36.6 C  SpO2: 98% 98%    Last Pain:  Vitals:   05/23/23 0800  TempSrc:   PainSc: 0-No pain                 Collene Schlichter

## 2023-05-23 NOTE — Progress Notes (Signed)
Occupational Therapy Evaluation Patient Details Name: Cassie Blake MRN: [id number] DOB: [birth year] Today's Date: 05/23/2023   History of Present Illness 32 y.o. female who presents with ongoing pain in the right leg. MRI reveals a recurrent disc herniation at L5/S1 on the right. She is s/p L5/S1 revison decompression and fusion 05/22/23. PMH of depression, GERD, Anxiety, Asthma, Impetigo, PONV   Clinical Impression   Pt admitted for above dx, PTA pt was ind in mobility and ADLs, she will be living alone soon but has assist of family and friends whenever needed. Pt currently ambulating in hall with Supervision + SPC for safety and demonstrated good carryover of POB precautions. Pt can complete LB ADLs with Supervision while maintaining precautions and does not need assist for STS. Pt also verbalized step by step sequence for car transfer. Pt has no further acute skilled OT needs at this time. No follow-up OT recommended.       If plan is discharge home, recommend the following: Assistance with cooking/housework;Assist for transportation    Functional Status Assessment  Patient has had a recent decline in their functional status and demonstrates the ability to make significant improvements in function in a reasonable and predictable amount of time.  Equipment Recommendations  None recommended by OT (Pt mom going to procure shower seat)    Recommendations for Other Services       Precautions / Restrictions Precautions Precautions: Back Precaution Booklet Issued: Yes (comment) Required Braces or Orthoses: Spinal Brace Spinal Brace: Thoracolumbosacral orthotic Restrictions Weight Bearing Restrictions: No      Mobility Bed Mobility               General bed mobility comments: Pt rec'd ambulating in hallway, left in care of PT providing care. Pt stated she performed log roll to get out of bed    Transfers Overall transfer level: Modified independent Equipment used: Straight  cane Transfers: Sit to/from Stand Sit to Stand: Modified independent (Device/Increase time)                  Balance Overall balance assessment: Mild deficits observed, not formally tested                                         ADL either performed or assessed with clinical judgement   ADL Overall ADL's : Needs assistance/impaired Eating/Feeding: Independent;Sitting   Grooming: Standing;Supervision/safety   Upper Body Bathing: Independent;Sitting   Lower Body Bathing: Modified independent;Sitting/lateral leans   Upper Body Dressing : Independent;Sitting   Lower Body Dressing: Modified independent;Sitting/lateral leans Lower Body Dressing Details (indicate cue type and reason): Pt donned/doffed bilat socks using figure four technique. Does not wear socks at baseline Toilet Transfer: Supervision/safety;Ambulation   Toileting- Clothing Manipulation and Hygiene: Sit to/from stand;Supervision/safety       Functional mobility during ADLs: Supervision/safety;Cane General ADL Comments: Pt with good understanding of back precautions and tranfer techniques.     Vision         Perception         Praxis         Pertinent Vitals/Pain Pain Assessment Pain Assessment: 0-10 Pain Score: 8  Pain Location: back, when donning socks using figure four Pain Descriptors / Indicators: Aching, Discomfort Pain Intervention(s): Limited activity within patient's tolerance, Monitored during session, Repositioned     Extremity/Trunk Assessment Upper Extremity Assessment Upper Extremity Assessment: Overall WFL for tasks assessed  Lower Extremity Assessment Lower Extremity Assessment: Defer to PT evaluation   Cervical / Trunk Assessment Cervical / Trunk Assessment: Back Surgery   Communication Communication Communication: No apparent difficulties   Cognition Arousal: Alert Behavior During Therapy: WFL for tasks assessed/performed Overall Cognitive Status:  Within Functional Limits for tasks assessed                                 General Comments: Recalls 3/3 POB precautions without cueing     General Comments  VSS    Exercises     Shoulder Instructions      Home Living Family/patient expects to be discharged to:: Private residence Living Arrangements: Alone (will be living alone very soon per Pt report) Available Help at Discharge: Family;Available PRN/intermittently (family/friends willing to help out whenever needed) Type of Home: House Home Access: Stairs to enter [employer] of Steps: 4 Entrance Stairs-Rails: None Home Layout: Two level;Able to live on main level with bedroom/bathroom Alternate Level Stairs-Number of Steps: 16 steps Alternate Level Stairs-Rails: Left Bathroom Shower/Tub: Producer, television/film/video: Handicapped height         Additional Comments: Has 5 dogs      Prior Functioning/Environment Prior Level of Function : Independent/Modified Independent             Mobility Comments: ind ADLs Comments: ind        OT Problem List: Pain      OT Treatment/Interventions:      OT Goals(Current goals can be found in the care plan section) Acute Rehab OT Goals Patient Stated Goal: To go home OT Goal Formulation: With patient Time For Goal Achievement: 06/06/23 Potential to Achieve Goals: Good  OT Frequency:      Co-evaluation              AM-PAC OT "6 Clicks" Daily Activity     Outcome Measure Help from another person eating meals?: None Help from another person taking care of personal grooming?: A Little Help from another person toileting, which includes using toliet, bedpan, or urinal?: A Little Help from another person bathing (including washing, rinsing, drying)?: None Help from another person to put on and taking off regular upper body clothing?: None Help from another person to put on and taking off regular lower body clothing?: None 6 Click  Score: 22   End of Session Equipment Utilized During Treatment: Back brace;Other (comment) [facility]) Nurse Communication: Mobility status  Activity Tolerance: Patient tolerated treatment well Patient left: Other (comment) (With PT at room doorway)  OT Visit Diagnosis: Pain Pain - part of body:  (back)                Time: 8119-1478 OT Time Calculation (min): 12 min Charges:  OT General Charges $OT Visit: 1 Visit OT Evaluation $OT Eval Low Complexity: 1 Low  05/23/2023  AB, OTR/L  Acute Rehabilitation Services  Office: [phone]   Tristan Schroeder 05/23/2023, 9:12 AM

## 2023-05-23 NOTE — Evaluation (Signed)
Physical Therapy Evaluation and Discharge  Patient Details Name: Cassie Blake MRN: [id number] DOB: 01/01/1991 Today's Date: 05/23/2023  History of Present Illness  Pt is a 32 y/o female who presents s/p L5-S1 revision decompression/fusion on 05/22/2023. PMH significant for impetigo, depression, asthma.  Clinical Impression  Patient evaluated by Physical Therapy with no further acute PT needs identified. All education has been completed and the patient has no further questions. Pt was able to demonstrate transfers and ambulation with gross modified independence and CGA on the stairs. Pt was educated on precautions, brace application/wearing schedule, appropriate activity progression, and car transfer. See below for any follow-up Physical Therapy or equipment needs. PT is signing off. Thank you for this referral.         If plan is discharge home, recommend the following: Help with stairs or ramp for entrance;Assist for transportation   Can travel by private vehicle        Equipment Recommendations Cane  Recommendations for Other Services       Functional Status Assessment Patient has had a recent decline in their functional status and demonstrates the ability to make significant improvements in function in a reasonable and predictable amount of time.     Precautions / Restrictions Precautions Precautions: Back;Fall Precaution Booklet Issued: Yes (comment) Precaution Comments: Reviewed handout and pt was cued for precautions during functional mobility. Required Braces or Orthoses: Spinal Brace Spinal Brace: Thoracolumbosacral orthotic;Applied in sitting position Restrictions Weight Bearing Restrictions: No      Mobility  Bed Mobility               General bed mobility comments: Reviewed log roll verbally    Transfers Overall transfer level: Modified independent Equipment used: Straight cane Transfers: Sit to/from Stand                   Ambulation/Gait Ambulation/Gait assistance: Modified independent (Device/Increase time) Gait Distance (Feet): 200 Feet Assistive device: Straight cane Gait Pattern/deviations: Step-through pattern, Decreased stride length, Trunk flexed Gait velocity: Decreased Gait velocity interpretation: 1.31 - 2.62 ft/sec, indicative of limited community ambulator   General Gait Details: VC's for improved posture. Good sequencing with the SPC. Slow but generally steady.  Stairs Stairs: Yes Stairs assistance: Contact guard assist Stair Management: One rail Left, Step to pattern, Forwards Number of Stairs: 10 General stair comments: VC's for sequencing and general safety. No assist required but close guard provided for safety.  Wheelchair Mobility     Tilt Bed    Modified Rankin (Stroke Patients Only)       Balance Overall balance assessment: Mild deficits observed, not formally tested                                           Pertinent Vitals/Pain Pain Assessment Pain Assessment: Faces Faces Pain Scale: Hurts little more Pain Location: back Pain Descriptors / Indicators: Operative site guarding, Sore Pain Intervention(s): Limited activity within patient's tolerance, Monitored during session, Repositioned    Home Living Family/patient expects to be discharged to:: Private residence Living Arrangements: Alone Available Help at Discharge: Family;Available PRN/intermittently Type of Home: House Home Access: Stairs to enter Entrance Stairs-Rails: None Entrance Stairs-Number of Steps: 4 Alternate Level Stairs-Number of Steps: 16 steps Home Layout: Two level;Able to live on main level with bedroom/bathroom   Additional Comments: Has 5 dogs    Prior Function Prior Level of Function :  Independent/Modified Independent             Mobility Comments: ind ADLs Comments: ind     Extremity/Trunk Assessment   Upper Extremity Assessment Upper Extremity  Assessment: Overall WFL for tasks assessed    Lower Extremity Assessment Lower Extremity Assessment: Generalized weakness (Mild; consistent with pre-op diagnosis)    Cervical / Trunk Assessment Cervical / Trunk Assessment: Back Surgery  Communication   Communication Communication: No apparent difficulties  Cognition Arousal: Alert Behavior During Therapy: WFL for tasks assessed/performed Overall Cognitive Status: Within Functional Limits for tasks assessed                                          General Comments General comments (skin integrity, edema, etc.): VSS    Exercises     Assessment/Plan    PT Assessment Patient needs continued PT services  PT Problem List Decreased strength;Decreased activity tolerance;Decreased mobility;Decreased balance;Decreased knowledge of use of DME;Decreased safety awareness;Decreased knowledge of precautions;Pain       PT Treatment Interventions DME instruction;Gait training;Stair training;Functional mobility training;Therapeutic activities;Therapeutic exercise;Balance training;Patient/family education    PT Goals (Current goals can be found in the Care Plan section)  Acute Rehab PT Goals Patient Stated Goal: Home today, ASAP PT Goal Formulation: All assessment and education complete, DC therapy    Frequency Min 5X/week     Co-evaluation               AM-PAC PT "6 Clicks" Mobility  Outcome Measure Help needed turning from your back to your side while in a flat bed without using bedrails?: None Help needed moving from lying on your back to sitting on the side of a flat bed without using bedrails?: None Help needed moving to and from a bed to a chair (including a wheelchair)?: A Little Help needed standing up from a chair using your arms (e.g., wheelchair or bedside chair)?: None Help needed to walk in hospital room?: None Help needed climbing 3-5 steps with a railing? : A Little 6 Click Score: 22    End of  Session Equipment Utilized During Treatment: Gait belt;Back brace Activity Tolerance: Patient tolerated treatment well Patient left: in bed;with call bell/phone within reach;with family/visitor present Nurse Communication: Mobility status PT Visit Diagnosis: Unsteadiness on feet (R26.81);Pain Pain - part of body:  (back)    Time: 6578-4696 PT Time Calculation (min) (ACUTE ONLY): 20 min   Charges:   PT Evaluation $PT Eval Low Complexity: 1 Low   PT General Charges $$ ACUTE PT VISIT: 1 Visit         Conni Slipper, PT, DPT Acute Rehabilitation Services Secure Chat Preferred Office: [phone]   Marylynn Pearson 05/23/2023, 10:02 AM

## 2023-05-23 NOTE — Progress Notes (Signed)
    Patient denies leg pain Minimal low back pain   Physical Exam: Vitals:   05/23/23 0000 05/23/23 0308  BP: 109/60 (!) 144/92  Pulse: 79 82  Resp: 16 18  Temp: 98.3 F (36.8 C) 98.3 F (36.8 C)  SpO2: 93% 98%    Dressing in place NVI  POD #1 s/p L5/S1 revison decompression and fusion  - up with PT/OT, encourage ambulation - Percocet and oxycontin for pain, Robaxin for muscle spasms - d/c home today with f/u in 2 weeks

## 2023-05-27 ENCOUNTER — Encounter (HOSPITAL_COMMUNITY): Payer: Self-pay | Admitting: Orthopedic Surgery

## 2023-06-14 NOTE — Discharge Summary (Signed)
Patient ID: Cassie Blake MRN: [id number] DOB/AGE: September 21, 1991 32 y.o.  Admit date: 05/22/2023 Discharge date: 05/23/2023  Admission Diagnoses:  Principal Problem:   Radiculopathy of lumbar region   Discharge Diagnoses:  Same  Past Medical History:  Diagnosis Date   Allergy    Anxiety    Asthma    Clostridium difficile infection    being treated currently   Complication of anesthesia    Constipation, chronic    Depression    GERD (gastroesophageal reflux disease)    H/O drug abuse (HCC)    Impetigo    Polycystic ovary syndrome    PONV (postoperative nausea and vomiting)     Surgeries: Procedure(s): RIGHT-SIDED LUMBAR 5 - SACRUM 1 TRANSFORAMINAL LUMBAR INTERBODY FUSION AND DECOMPRESSION WITH INSTRUMENTATION AND ALLOGRAFT on 05/22/2023   Consultants: None?  Discharged Condition: Improved  Hospital Course: Cassie Blake is an 32 y.o. female who was admitted 05/22/2023 for operative treatment of Radiculopathy of lumbar region. Patient has severe unremitting pain that affects sleep, daily activities, and work/hobbies. After pre-op clearance the patient was taken to the operating room on 05/22/2023 and underwent  Procedure(s): RIGHT-SIDED LUMBAR 5 - SACRUM 1 TRANSFORAMINAL LUMBAR INTERBODY FUSION AND DECOMPRESSION WITH INSTRUMENTATION AND ALLOGRAFT.    Patient was given perioperative antibiotics:  Anti-infectives (From admission, onward)    Start     Dose/Rate Route Frequency Ordered Stop   05/22/23 1630  ceFAZolin (ANCEF) IVPB 2g/100 mL premix        2 g 200 mL/hr over 30 Minutes Intravenous Every 8 hours 05/22/23 1531 05/23/23 0020   05/22/23 0700  ceFAZolin (ANCEF) IVPB 3g/100 mL premix  Status:  Discontinued        3 g 200 mL/hr over 30 Minutes Intravenous On call to O.R. 05/22/23 7829 05/22/23 1529        Patient was given sequential compression devices, early ambulation to prevent DVT.  Patient benefited maximally from hospital stay and there were no complications.     Recent vital signs: No data found.   Discharge Medications:   Allergies as of 05/23/2023   No Known Allergies      Medication List     STOP taking these medications    HYDROcodone-acetaminophen 5-325 MG tablet Commonly known as: NORCO/VICODIN       TAKE these medications    albuterol 108 (90 Base) MCG/ACT inhaler Commonly known as: VENTOLIN HFA Inhale 1-2 puffs into the lungs every 6 (six) hours as needed for wheezing.   lidocaine 5 % Commonly known as: Lidoderm Place 1 patch onto the skin daily. Remove & Discard patch within 12 hours or as directed by MD   methocarbamol 500 MG tablet Commonly known as: ROBAXIN Take 500-1,000 mg by mouth every 6 (six) hours as needed for muscle spasms. What changed: Another medication with the same name was added. Make sure you understand how and when to take each.   methocarbamol 500 MG tablet Commonly known as: ROBAXIN Take 1-2 tablets (500-1,000 mg total) by mouth every 6 (six) hours as needed for muscle spasms. What changed: You were already taking a medication with the same name, and this prescription was added. Make sure you understand how and when to take each.   Mucinex DM Maximum Strength 60-1200 MG Tb12 Take 1 tablet by mouth every 12 (twelve) hours.   ondansetron 4 MG disintegrating tablet Commonly known as: Zofran ODT 4mg  ODT q4 hours prn nausea/vomit   oxyCODONE 20 mg 12 hr tablet Commonly known as:  OXYCONTIN Take 1 tablet (20 mg total) by mouth every 12 (twelve) hours.   oxyCODONE-acetaminophen 5-325 MG tablet Commonly known as: PERCOCET/ROXICET Take 1-2 tablets by mouth every 4 (four) hours as needed for severe pain.        Diagnostic Studies: DG Lumbar Spine 2-3 Views  Result Date: 05/22/2023 CLINICAL DATA:  Elective surgery. EXAM: LUMBAR SPINE - 2-3 VIEW COMPARISON:  Preoperative imaging. FINDINGS: Two fluoroscopic spot views of the lumbar spine obtained in the operating room in frontal and lateral  projections. Posterior rod with intrapedicular screw fusion and interbody spacer at L5-S1. Fluoroscopy time 52 seconds. Dose 57.29 mGy. IMPRESSION: Intraoperative fluoroscopy during L5-S1 fusion. Electronically Signed   By: Narda Rutherford M.D.   On: 05/22/2023 15:44   DG Lumbar Spine 1 View  Result Date: 05/22/2023 CLINICAL DATA:  [id number] Elective surgery [id number] EXAM: LUMBAR SPINE - 1 VIEW COMPARISON:  Preoperative imaging demonstrating 5 non-rib-bearing lumbar vertebra. FINDINGS: Portable cross-table lateral view of the lumbar spine obtained in the operating room. Surgical instruments demonstrated posteriorly at the L5 level. IMPRESSION: Portable cross-table lateral view of the lumbar spine obtained in the operating room. Electronically Signed   By: Narda Rutherford M.D.   On: 05/22/2023 15:40   DG C-Arm 1-60 Min-No Report  Result Date: 05/22/2023 Fluoroscopy was utilized by the requesting physician.  No radiographic interpretation.   DG C-Arm 1-60 Min-No Report  Result Date: 05/22/2023 Fluoroscopy was utilized by the requesting physician.  No radiographic interpretation.   DG C-Arm 1-60 Min-No Report  Result Date: 05/22/2023 Fluoroscopy was utilized by the requesting physician.  No radiographic interpretation.    Disposition: Discharge disposition: 01-Home or Self Care        POD #1 s/p L5/S1 revison decompression and fusion   - up with PT/OT, encourage ambulation - Percocet and oxycontin for pain, Robaxin for muscle spasms -Scripts for pain sent to pharmacy electronically  -D/C instructions sheet printed and in chart -D/C today  -F/U in office 2 weeks   Signed: Eilene Ghazi Shandon Matson 06/14/2023, 10:24 AM

## 2023-12-12 DIAGNOSIS — Z760 Encounter for issue of repeat prescription: Secondary | ICD-10-CM | POA: Diagnosis not present

## 2023-12-12 DIAGNOSIS — R062 Wheezing: Secondary | ICD-10-CM | POA: Diagnosis not present

## 2023-12-12 DIAGNOSIS — J45909 Unspecified asthma, uncomplicated: Secondary | ICD-10-CM | POA: Diagnosis not present

## 2024-01-23 DIAGNOSIS — M129 Arthropathy, unspecified: Secondary | ICD-10-CM | POA: Diagnosis not present

## 2024-01-28 IMAGING — US US PELVIS COMPLETE TRANSABD/TRANSVAG W DUPLEX AND/OR DOPPLER
2 series · 13 of 25 positions shown · non-contrast
Comparison: 05/10/2015

CLINICAL DATA: LEFT lower quadrant pain possible UTI, history of
PCOS

EXAM:
TRANSABDOMINAL AND TRANSVAGINAL ULTRASOUND OF PELVIS
DOPPLER ULTRASOUND OF OVARIES
TECHNIQUE: Both transabdominal and transvaginal ultrasound examinations of the
pelvis were performed. Transabdominal technique was performed for
global imaging of the pelvis including uterus, ovaries, adnexal
regions, and pelvic cul-de-sac.
It was necessary to proceed with endovaginal exam following the
transabdominal exam to visualize the endometrium. Color and duplex
Doppler ultrasound was utilized to evaluate blood flow to the
ovaries.

[Series 1: us pelvis complete transabd/transvag w duplex and/ · 88 acquisitions, 12 frames shown (1 of 2)]
[im 1/88]
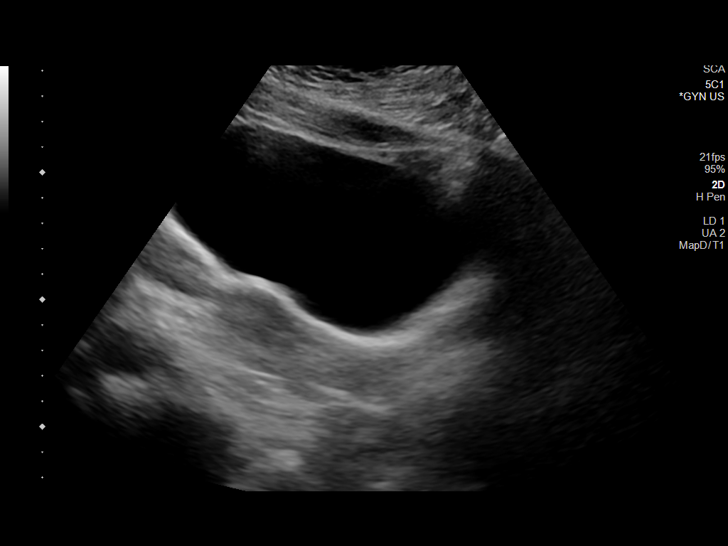
[im 8/88]
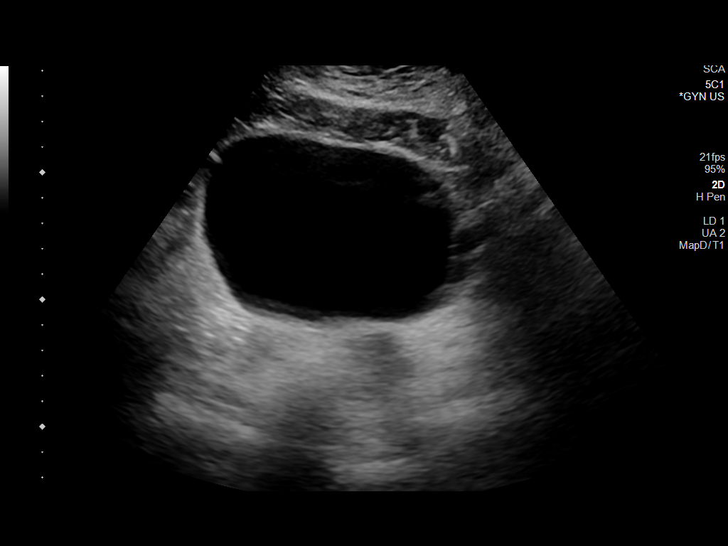
[im 16/88]
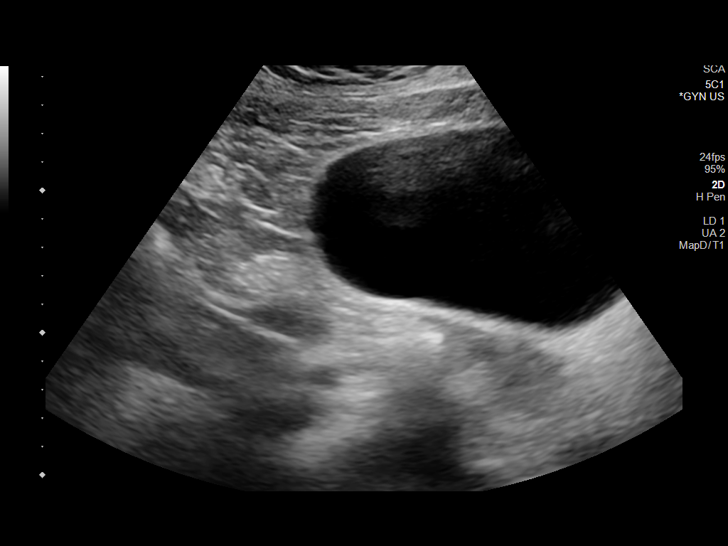
[im 23/88]
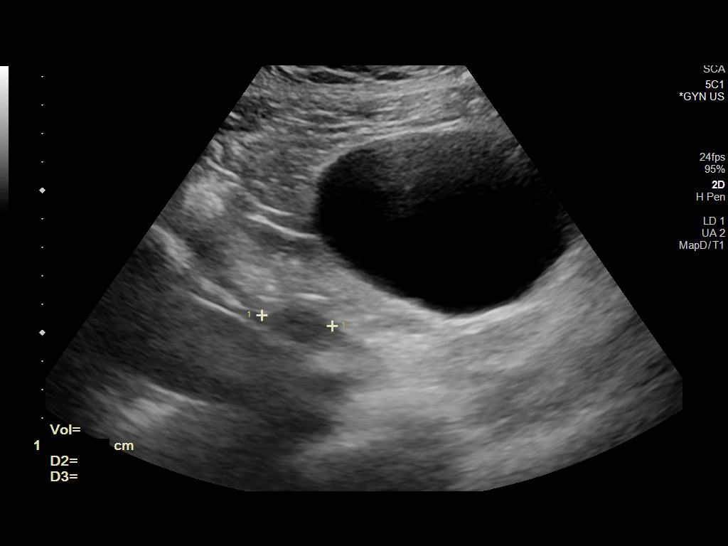
[im 31/88]
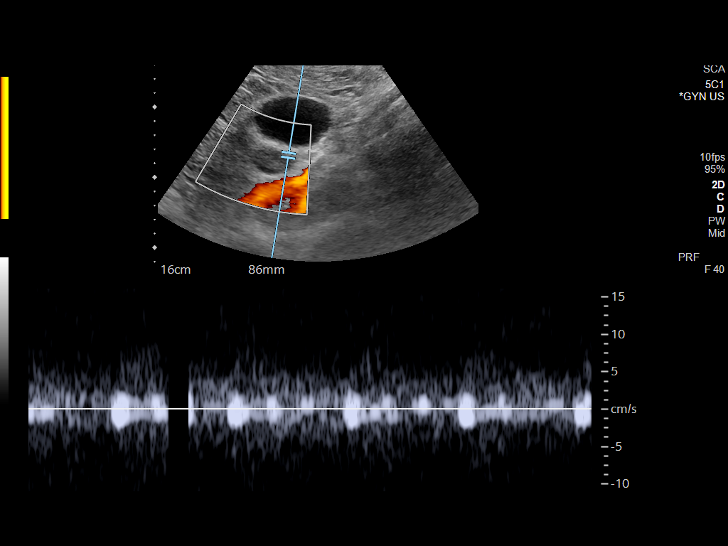
[im 38/88]
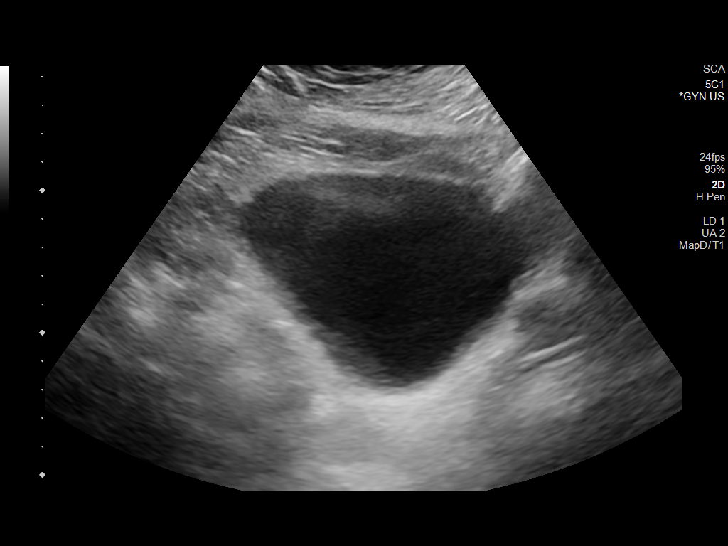
[im 46/88]
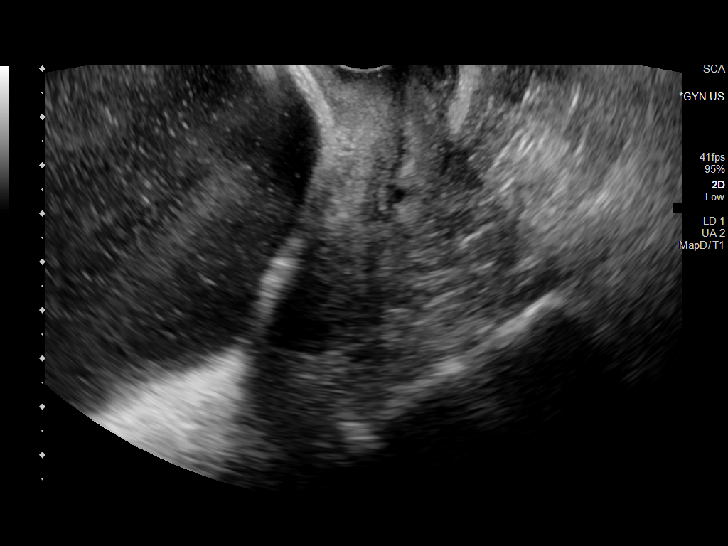
[im 53/88]
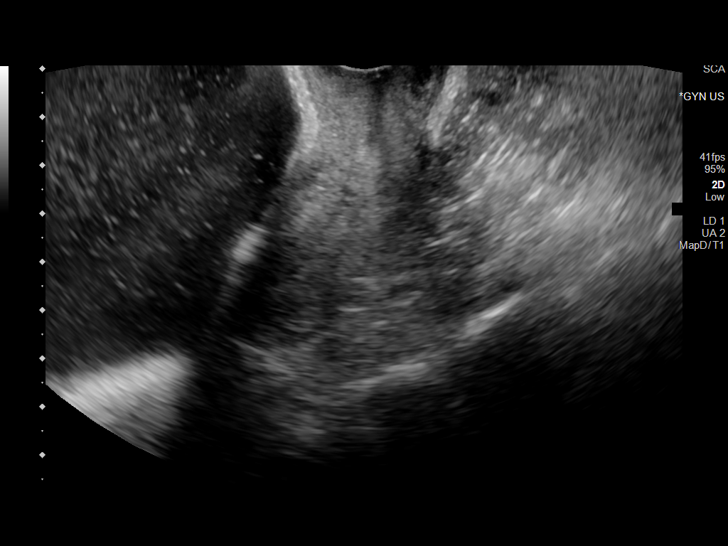
[im 61/88]
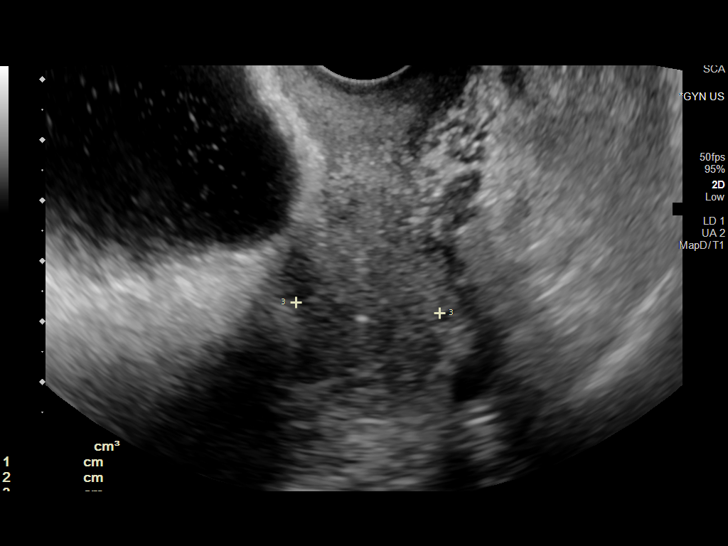
[im 69/88]
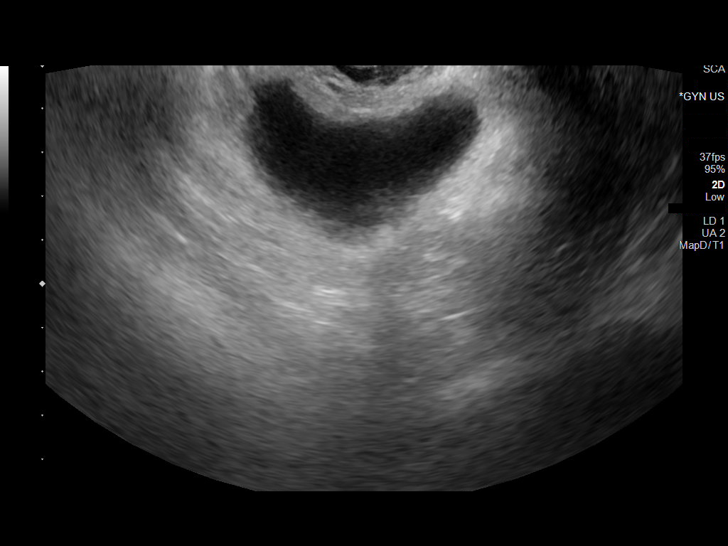
[im 76/88]
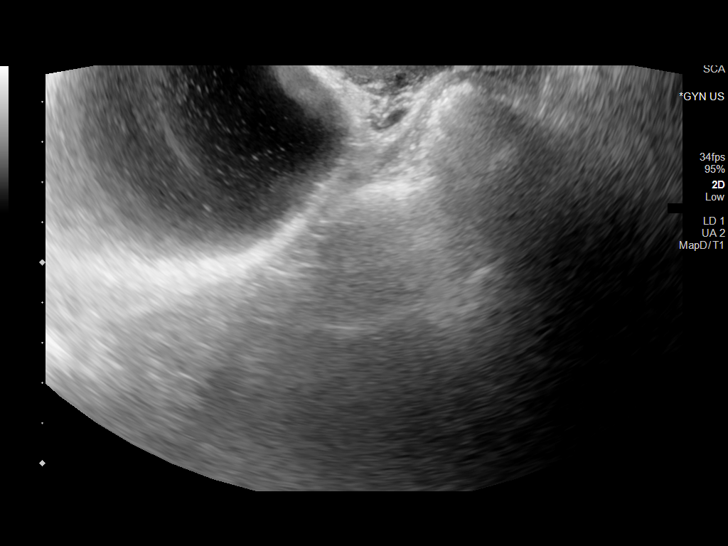
[im 84/88]
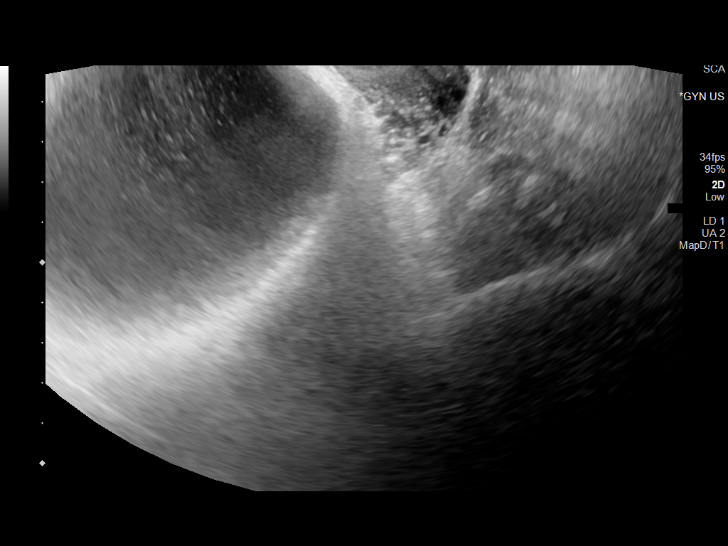

[Series 1001: us pelvis complete transabd/transvag w duplex and/ · 1 of 2 slices shown (2 of 2)]
[im 1/2]
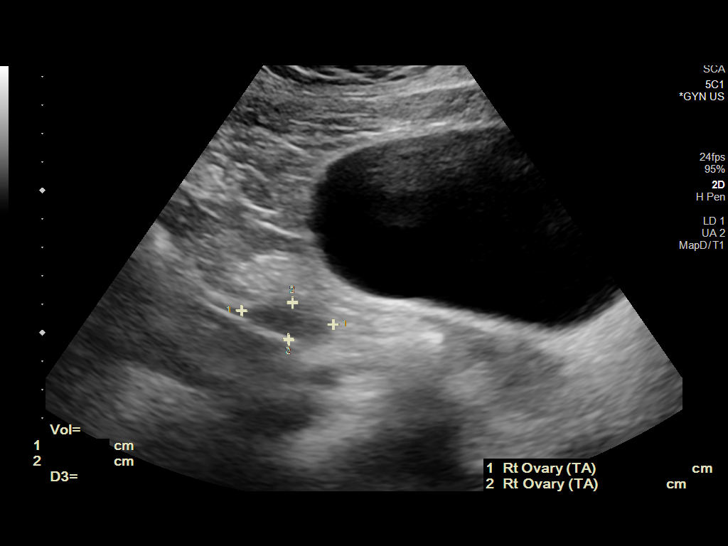

[13 of 25 positions shown; findings below may reference images not displayed]

FINDINGS: Uterus

Measurements: 8.3 x 2.2 x 2.6 cm = volume: 24 mL. Anteverted.
Slightly heterogeneous myometrium. No focal mass.

Endometrium

Thickness: 3 mm.  No endometrial fluid or mass

Right ovary

Measurements: 3.3 x 1.3 x 2.5 cm = volume: 5.6 mL. Not identified on
transvaginal imaging due to body habitus and bowel. Normal
morphology without mass

Left ovary

Measurements: 3.9 x 3.5 x 4.0 cm = volume: 28 mL. Not identified on
transvaginal imaging due to body habitus and bowel. Normal
morphology without mass

Pulsed Doppler evaluation of both ovaries demonstrates normal
low-resistance arterial and venous waveforms.

Other findings

No free pelvic fluid. No adnexal masses. Significant debris within
urinary bladder
IMPRESSION: Unremarkable uterus, endometrial complex, and ovaries.

Significant debris within urinary bladder, nonspecific but could
reflect urinary tract infection; recommend correlation with
urinalysis.

## 2024-01-28 IMAGING — CT CT ABD-PELV W/ CM
2 of 4 series · 16 of 46 positions shown, 18 images · IV contrast (agent unspecified)
Comparison: April 2021

CLINICAL DATA: LLQ abdominal pain

EXAM:
CT ABDOMEN AND PELVIS WITH CONTRAST
TECHNIQUE: Multidetector CT imaging of the abdomen and pelvis was performed
using the standard protocol following bolus administration of
intravenous contrast.

[Series 2: axial st · axial · 0.98mm/px · z∈[-547,-62]mm · 13 of 107 slices shown, 15 images]
[im 5/107  soft-tissue]
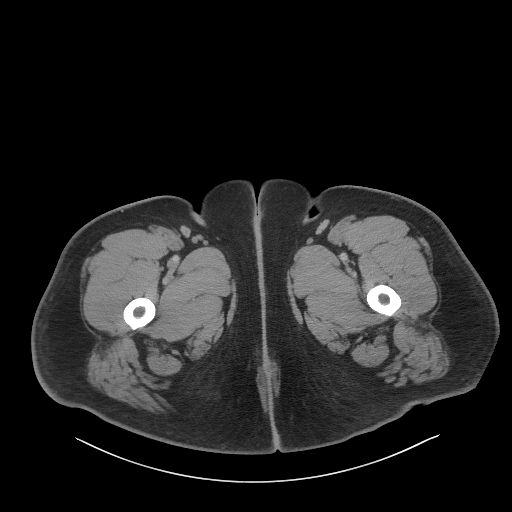
[im 5/107  bone]
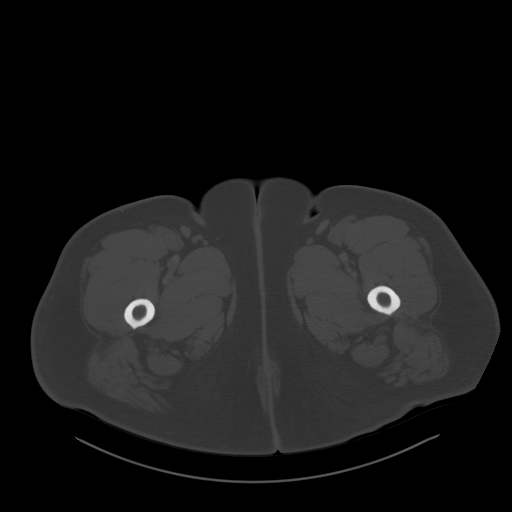
[im 13/107  soft-tissue]
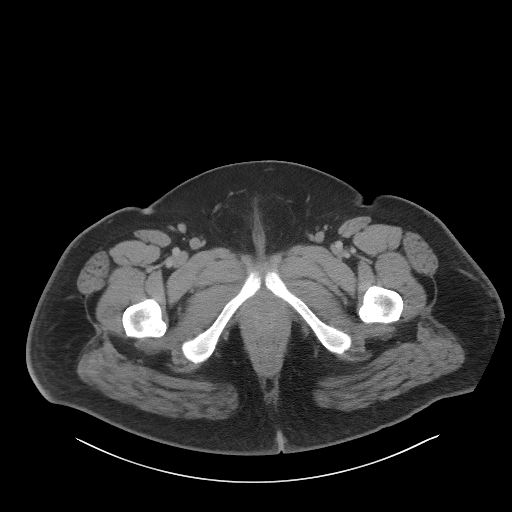
[im 22/107  soft-tissue]
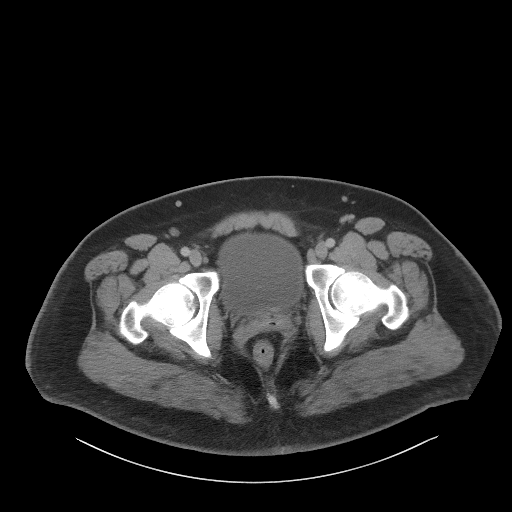
[im 30/107  soft-tissue]
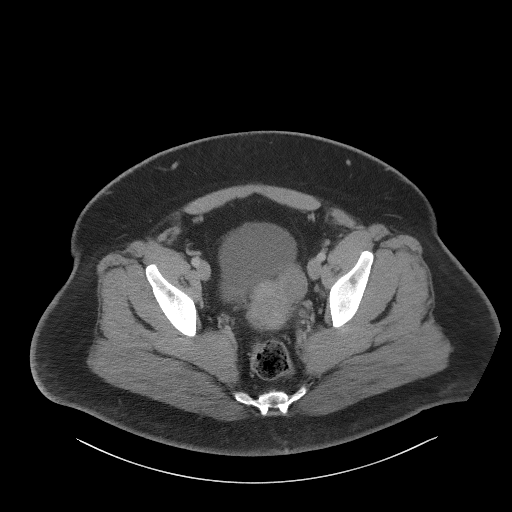
[im 39/107  soft-tissue]
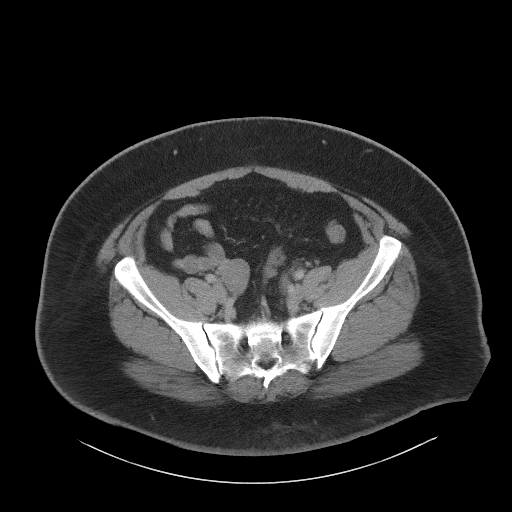
[im 47/107  soft-tissue]
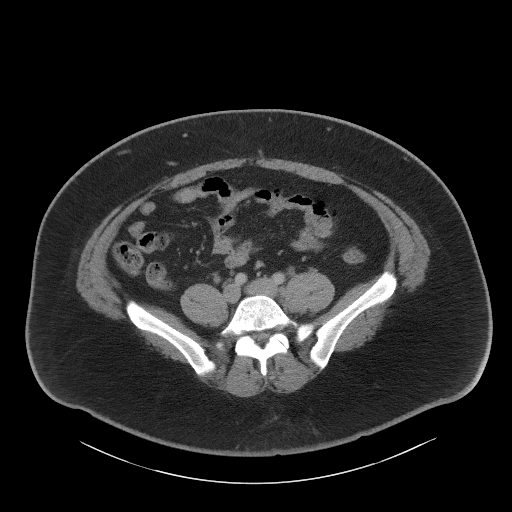
[im 56/107  soft-tissue]
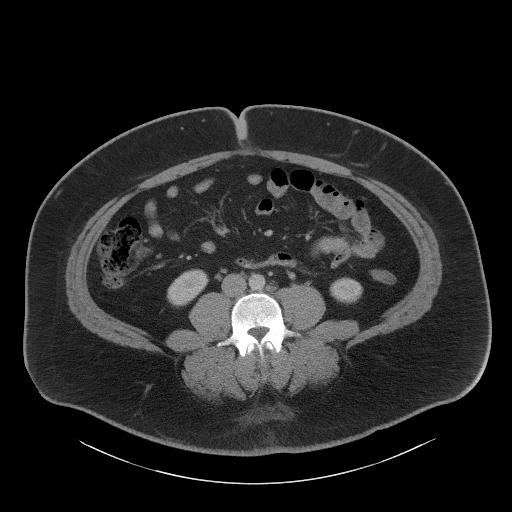
[im 60/107  soft-tissue]
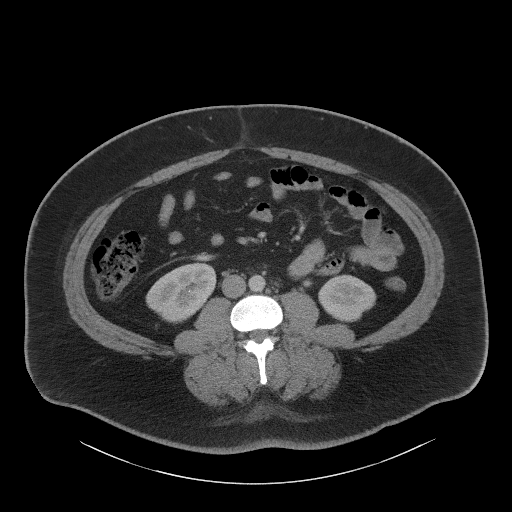
[im 68/107  soft-tissue]
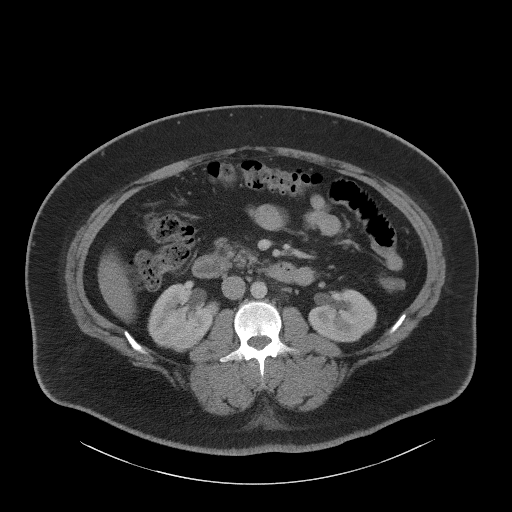
[im 68/107  bone]
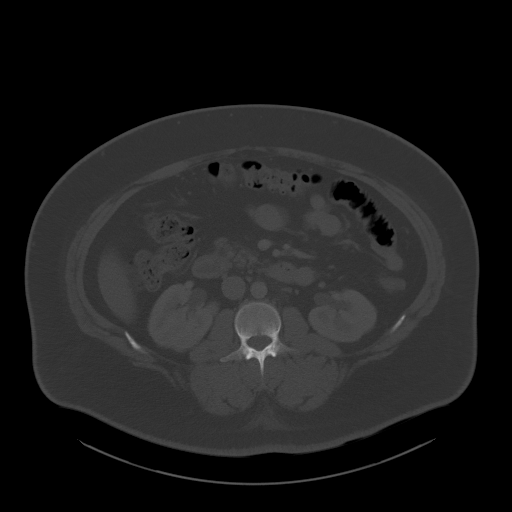
[im 77/107  soft-tissue]
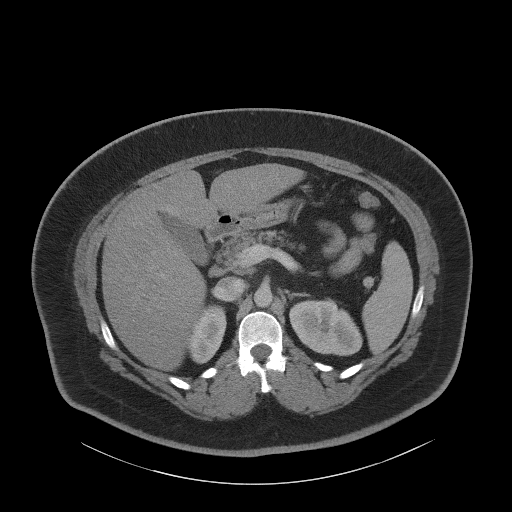
[im 85/107  soft-tissue]
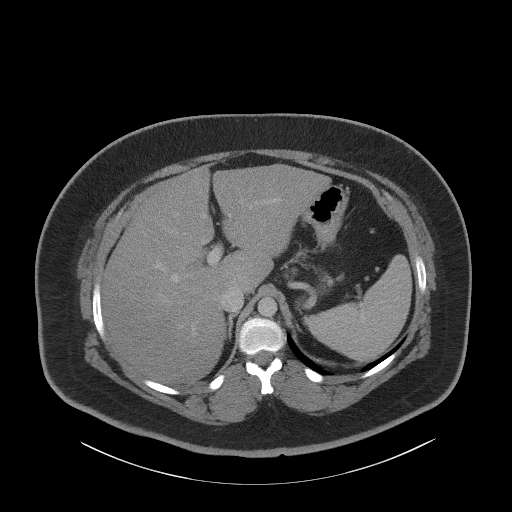
[im 94/107  soft-tissue]
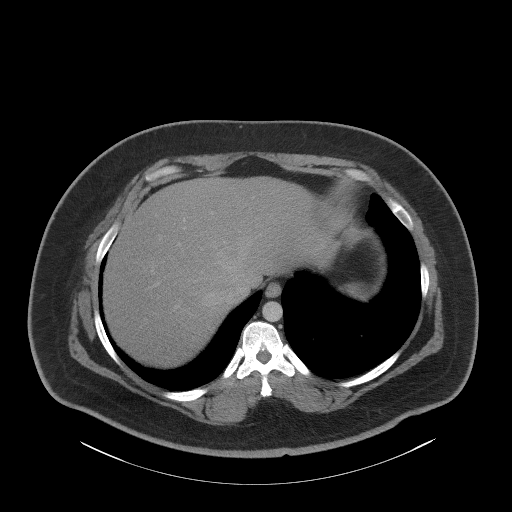
[im 102/107  soft-tissue]
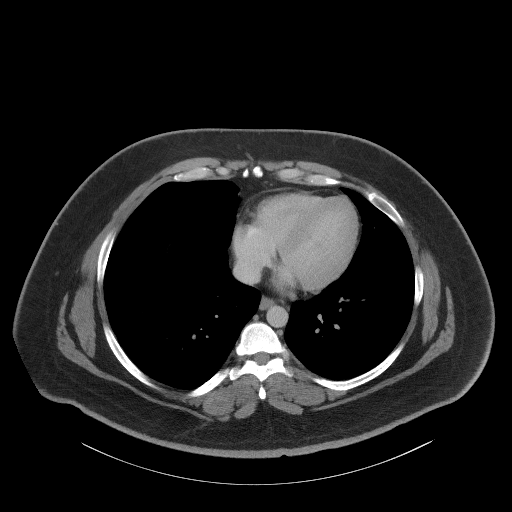

[Series 5: coronal st · coronal · 0.94mm/px · 3 of 115 slices shown]
[im 39/115  soft-tissue]
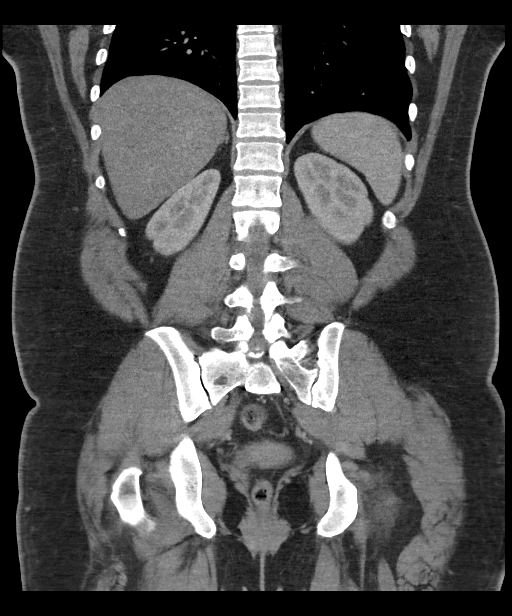
[im 51/115  soft-tissue]
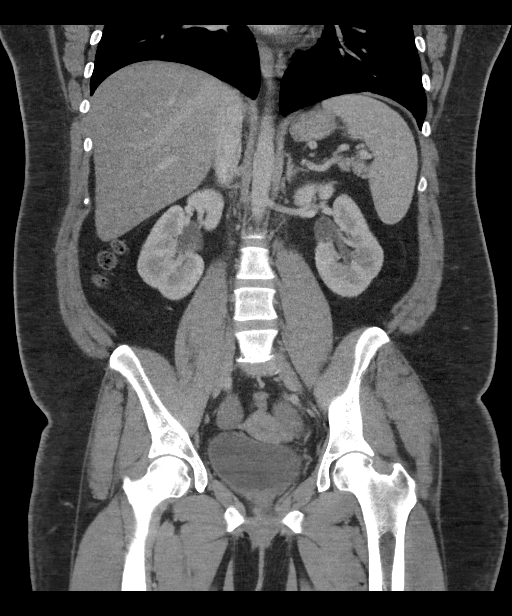
[im 64/115  soft-tissue]
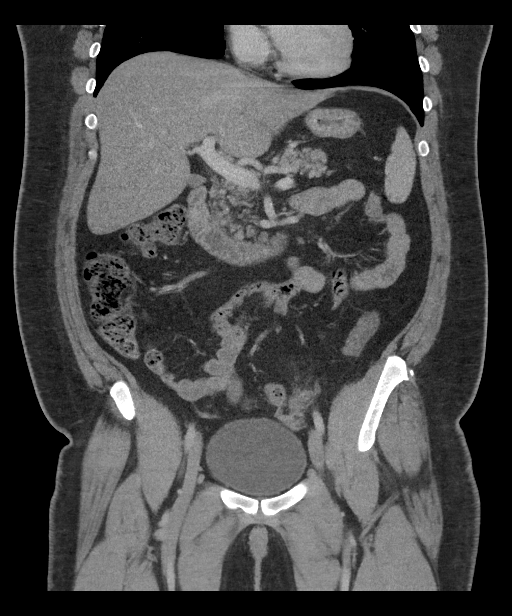

[16 of 46 positions shown; findings below may reference images not displayed]

RADIATION DOSE REDUCTION: This exam was performed according to the
departmental dose-optimization program which includes automated
exposure control, adjustment of the mA and/or kV according to
patient size and/or use of iterative reconstruction technique.

CONTRAST:  100mL OMNIPAQUE IOHEXOL 300 MG/ML  SOLN
FINDINGS: Inferior chest: Partially visualized focus of ground-glass
consolidation in the mid right lung.

Hepatobiliary: Hepatic steatosis. No focal liver lesion. No
intrahepatic or extrahepatic biliary ductal dilation. The
gallbladder appears normal.

Spleen: Normal in size without focal abnormality.

Pancreas: No pancreatic ductal dilatation or surrounding
inflammatory changes.

Adrenals/Urinary Tract: Adrenal glands are unremarkable. The kidneys
are normal in size without focal lesion. No significant
hydronephrosis. Punctate nonobstructive nephrolithiasis bilaterally.
There are subtle inflammatory changes and fat stranding surrounding
the left mid ureter (see key image). Bladder is unremarkable.

Stomach/Bowel: The stomach, small bowel and large bowel are normal
in caliber without abnormal wall thickening or surrounding
inflammatory changes.

Reproductive: Uterus and bilateral adnexa are unremarkable. Normal
physiologic appearance.

Lymphatic: Several small reactive appearing periaortic lymph nodes
are identified in the left retroperitoneum.

Vasculature: The abdominal aorta is normal in caliber. The portal
venous system is patent.

Other: No abdominopelvic ascites.

Musculoskeletal: No aggressive osseous lesions. Degenerative changes
at L5-S1. The soft tissues are unremarkable.
IMPRESSION: 1. There are subtle inflammatory changes surrounding the left mid
ureter, suspicious for ascending urinary tract infection, and would
appear compatible with recent urinalysis.
2. There is a partially visualized focus of ground-glass
consolidation in the right mid lung, possibly infectious. Chest
radiograph is recommended to complete evaluation.
3. Hepatic steatosis.
4. Bilateral punctate nonobstructive nephrolithiasis.

## 2024-01-30 DIAGNOSIS — E039 Hypothyroidism, unspecified: Secondary | ICD-10-CM | POA: Insufficient documentation

## 2024-01-30 DIAGNOSIS — E7800 Pure hypercholesterolemia, unspecified: Secondary | ICD-10-CM | POA: Insufficient documentation

## 2024-01-30 DIAGNOSIS — E109 Type 1 diabetes mellitus without complications: Secondary | ICD-10-CM | POA: Insufficient documentation

## 2024-02-20 ENCOUNTER — Other Ambulatory Visit (HOSPITAL_BASED_OUTPATIENT_CLINIC_OR_DEPARTMENT_OTHER): Payer: Self-pay | Admitting: Student

## 2024-02-20 ENCOUNTER — Ambulatory Visit (INDEPENDENT_AMBULATORY_CARE_PROVIDER_SITE_OTHER): Admitting: Student

## 2024-02-20 ENCOUNTER — Encounter (HOSPITAL_BASED_OUTPATIENT_CLINIC_OR_DEPARTMENT_OTHER): Payer: Self-pay | Admitting: Student

## 2024-02-20 VITALS — BP 135/82 | HR 77 | Temp 97.8°F | Resp 16 | Ht 66.54 in | Wt 270.6 lb

## 2024-02-20 DIAGNOSIS — E7800 Pure hypercholesterolemia, unspecified: Secondary | ICD-10-CM | POA: Diagnosis not present

## 2024-02-20 DIAGNOSIS — Z7689 Persons encountering health services in other specified circumstances: Secondary | ICD-10-CM | POA: Diagnosis not present

## 2024-02-20 DIAGNOSIS — M5416 Radiculopathy, lumbar region: Secondary | ICD-10-CM | POA: Diagnosis not present

## 2024-02-20 DIAGNOSIS — J4530 Mild persistent asthma, uncomplicated: Secondary | ICD-10-CM | POA: Diagnosis not present

## 2024-02-20 DIAGNOSIS — N200 Calculus of kidney: Secondary | ICD-10-CM | POA: Diagnosis not present

## 2024-02-20 DIAGNOSIS — E559 Vitamin D deficiency, unspecified: Secondary | ICD-10-CM

## 2024-02-20 DIAGNOSIS — E282 Polycystic ovarian syndrome: Secondary | ICD-10-CM | POA: Diagnosis not present

## 2024-02-20 DIAGNOSIS — E1141 Type 2 diabetes mellitus with diabetic mononeuropathy: Secondary | ICD-10-CM

## 2024-02-20 DIAGNOSIS — E039 Hypothyroidism, unspecified: Secondary | ICD-10-CM | POA: Diagnosis not present

## 2024-02-20 MED ORDER — TIRZEPATIDE 2.5 MG/0.5ML ~~LOC~~ SOAJ
2.5000 mg | SUBCUTANEOUS | 6 refills | Status: DC
Start: 2024-02-20 — End: 2024-02-27

## 2024-02-20 MED ORDER — ALBUTEROL SULFATE HFA 108 (90 BASE) MCG/ACT IN AERS
1.0000 | INHALATION_SPRAY | Freq: Four times a day (QID) | RESPIRATORY_TRACT | 6 refills | Status: AC | PRN
Start: 2024-02-20 — End: 2025-10-05

## 2024-02-20 NOTE — Assessment & Plan Note (Addendum)
 Concern for unstable condition. Hypothyroidism with low energy and hair thinning. Recently started on levothyroxine three weeks ago. Not time to recheck TSH yet. - Recheck TSH in three weeks -Continue current dose of levothyroxine.

## 2024-02-20 NOTE — Patient Instructions (Addendum)
 It was nice to see you today!  As we discussed in clinic:  I will try to let you know if we need to discuss your labs from [facility].  If you have any problems before your next visit feel free to message me via MyChart (minor issues or questions) or call the office, otherwise you may reach out to schedule an office visit.  Thank you! Liller Yohn, PA-C

## 2024-02-20 NOTE — Progress Notes (Signed)
 New Patient Office Visit  Subjective    Patient ID: Cassie Blake, female    DOB: 29-Nov-1990  Age: 33 y.o. MRN: [id number]  CC:  Chief Complaint  Patient presents with   Establish Care    Pt. Here to establish care.    Back Pain    Pt. C/o lower back pain that's been going on for 10+ years.     Discussed the use of AI scribe software for clinical note transcription with the patient, who gave verbal consent to proceed.  History of Present Illness   Cassie Blake is a 33 year old female with type 2 diabetes, hypothyroidism, and PCOS who presents to establish care and address lower back pain. Prior PCP was [facility]. Last physical was 2 months ago.  She has experienced chronic lower back pain for ten years, with a history of back surgeries. She uses marijuana to manage the pain, preferring it over narcotics. In August, she had a fever that did not resolve well, but she currently has no signs of infection.  She has type 2 diabetes and experiences numbness in her right toes, initially thought to be related to back nerve issues. She has not been informed about necessary yearly diabetes management steps. She is currently on Mounjaro for diabetes management, having previously tried metformin, which caused severe gastrointestinal side effects.  Recently diagnosed with hypothyroidism, she started on levothyroxine three weeks ago. She reports decreased energy levels and significant hair thinning over the past year, which she attributes to thyroid issues, scalp psoriasis, and past anesthesia. She has not yet followed up with a dermatologist for her scalp psoriasis, despite trying various treatments including drops, creams, and pills.  She has a history of PCOS, with complications including cyst ruptures and amenorrhea for several years. She has not been on birth control recently and has not had a Pap smear in two years, though previous results were normal. She experiences hirsutism, describing 'a  full beard' growth.  She has a history of asthma, using an inhaler seasonally or when upset, approximately three times a week. [place] allerg[place], which may exacerbate her symptoms.  She has a history of kidney stones, requiring treatment such as lithotripsy and catheterization. She has tried pelvic physical therapy and Flomax, but has not pursued further interventions like ureter dilation.     Pertinent Procedures/Imaging: 05/21/24: RIGHT-SIDED LUMBAR 5 - SACRUM 1 TRANSFORAMINAL LUMBAR INTERBODY FUSION AND DECOMPRESSION WITH INSTRUMENTATION AND ALLOGRAFT   Screenings:  Colon Cancer: not indicated. Lung Cancer: no smoking or vaping. Smokes marijuana frequently. Breast Cancer: not indicated. Cervical Cancer: Last pap smear was 2 years. Results were normal. Diabetes: Type 2 Diabetes  Ophthalmology: Getting a yearly eye exam- discussed   Foot: indicated at next visit HLD: Indicated   Outpatient Encounter Medications as of 02/20/2024  Medication Sig   HYDROcodone -acetaminophen  (NORCO/VICODIN) 5-325 MG tablet Take 1 tablet by mouth.   levothyroxine (SYNTHROID) 50 MCG tablet Take 50 mcg by mouth every morning.   omega-3 acid ethyl esters (LOVAZA) 1 g capsule Take 2 capsules by mouth 2 (two) times daily.   tirzepatide (MOUNJARO) 2.5 MG/0.5ML Pen Inject 2.5 mg into the skin once a week.   Vitamin D, Ergocalciferol, (DRISDOL) 1.25 MG (50000 UNIT) CAPS capsule Take 50,000 Units by mouth once a week.   [DISCONTINUED] Tirzepatide (MOUNJARO [place])    albuterol  (VENTOLIN  HFA) 108 (90 Base) MCG/ACT inhaler Inhale 1-2 puffs into the lungs every 6 (six) hours  as needed for wheezing.   Dextromethorphan-Guaifenesin  (MUCINEX  DM MAXIMUM STRENGTH) 60-1200 MG TB12 Take 1 tablet by mouth every 12 (twelve) hours. (Patient not taking: Reported on 05/14/2023)   lidocaine  (LIDODERM ) 5 % Place 1 patch onto the skin daily. Remove & Discard patch within 12 hours or as directed by MD (Patient not  taking: Reported on 05/14/2023)   methocarbamol  (ROBAXIN ) 500 MG tablet Take 500-1,000 mg by mouth every 6 (six) hours as needed for muscle spasms.   methocarbamol  (ROBAXIN ) 500 MG tablet Take 1-2 tablets (500-1,000 mg total) by mouth every 6 (six) hours as needed for muscle spasms.   ondansetron  (ZOFRAN  ODT) 4 MG disintegrating tablet 4mg  ODT q4 hours prn nausea/vomit (Patient not taking: Reported on 05/14/2023)   oxyCODONE  (OXYCONTIN ) 20 mg 12 hr tablet Take 1 tablet (20 mg total) by mouth every 12 (twelve) hours.   oxyCODONE -acetaminophen  (PERCOCET/ROXICET) 5-325 MG tablet Take 1-2 tablets by mouth every 4 (four) hours as needed for severe pain.   [DISCONTINUED] albuterol  (PROVENTIL  HFA;VENTOLIN  HFA) 108 (90 BASE) MCG/ACT inhaler Inhale 1-2 puffs into the lungs every 6 (six) hours as needed for wheezing. (Patient not taking: Reported on 05/14/2023)   No facility-administered encounter medications on file as of 02/20/2024.    Past Medical History:  Diagnosis Date   Allergy    Anxiety    Asthma    Clostridium difficile infection    being treated currently   Complication of anesthesia    Constipation, chronic    Depression    GERD (gastroesophageal reflux disease)    H/O drug abuse (HCC)    Impetigo    Polycystic ovary syndrome    PONV (postoperative nausea and vomiting)     Past Surgical History:  Procedure Laterality Date   CYSTOSCOPY/URETEROSCOPY/HOLMIUM LASER/STENT PLACEMENT Right 05/28/2015   Procedure: CYSTOSCOPY/RIGHT RETROGRADE PYELOGRAM/ RIGHT URETEROSCOPY/STONE BASKETRY/HOLMIUM LASER APPLICATION;  Surgeon: Florencio Hunting, MD;  Location: [facility];  Service: Urology;  Laterality: Right;   MICRODISCECTOMY LUMBAR     PILONIDAL CYST DRAINAGE     TRANSFORAMINAL LUMBAR INTERBODY FUSION (TLIF) WITH PEDICLE SCREW FIXATION 1 LEVEL Right 05/22/2023   Procedure: RIGHT-SIDED LUMBAR 5 - SACRUM 1 TRANSFORAMINAL LUMBAR INTERBODY FUSION AND DECOMPRESSION WITH INSTRUMENTATION AND ALLOGRAFT;   Surgeon: Virl Grimes, MD;  Location: [facility];  Service: Orthopedics;  Laterality: Right;    History reviewed. No pertinent family history.  Social History   Socioeconomic History   Marital status: Single    Spouse name: Not on file   Number of children: Not on file   Years of education: Not on file   Highest education level: Not on file  Occupational History   Not on file  Tobacco Use   Smoking status: Former    Current packs/day: 0.00    Average packs/day: 0.3 packs/day for 11.0 years (2.8 ttl pk-yrs)    Types: Cigarettes    Quit date: 02/26/2023    Years since quitting: 0.9   Smokeless tobacco: Never  Vaping Use   Vaping status: Some Days  Substance and Sexual Activity   Alcohol use: Yes    Alcohol/week: 2.0 standard drinks of alcohol    Types: 2 Glasses of wine per week    Comment: socially   Drug use: Yes    Types: Marijuana    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   Sexual activity: Not on file  Other Topics Concern   Not on file  Social History Narrative   Not on file   Social Drivers  of Health   Financial Resource Strain: Patient Declined (02/18/2024)   Overall Financial Resource Strain (CARDIA)    Difficulty of Paying Living Expenses: Patient declined  Food Insecurity: Patient Declined (02/18/2024)   Hunger Vital Sign    Worried About Running Out of Food in the Last Year: Patient declined    Ran Out of Food in the Last Year: Patient declined  Transportation Needs: Patient Declined (02/18/2024)   PRAPARE - Administrator, Civil Service (Medical): Patient declined    Lack of Transportation (Non-Medical): Patient declined  Physical Activity: Sufficiently Active (02/18/2024)   Exercise Vital Sign    Days of Exercise per Week: 7 days    Minutes of Exercise per Session: 30 min  Stress: No Stress Concern Present (02/18/2024)   [employer] of Occupational Health - Occupational Stress Questionnaire    Feeling of Stress : Only a little   Social Connections: Unknown (02/18/2024)   Social Connection and Isolation Panel [NHANES]    Frequency of Communication with Friends and Family: Patient declined    Frequency of Social Gatherings with Friends and Family: Patient declined    Attends Religious Services: Patient declined    Database administrator or Organizations: Patient declined    Attends Engineer, structural: Not on file    Marital Status: Patient declined  Catering manager Violence: Not on file    ROS  Per HPI      Objective    BP 135/82   Pulse 77   Temp 97.8 F (36.6 C) (Oral)   Resp 16   Ht 5' 6.54" (1.69 m)   Wt 270 lb 9.6 oz (122.7 kg)   LMP  (LMP Unknown) Comment: Pt. stats of having PCOS  SpO2 96%   BMI 42.98 kg/m   Physical Exam Constitutional:      General: She is not in acute distress.    Appearance: Normal appearance. She is not ill-appearing.  HENT:     Head: Normocephalic and atraumatic.     Right Ear: External ear normal.     Left Ear: External ear normal.     Nose: Nose normal.     Mouth/Throat:     Mouth: Mucous membranes are moist.     Pharynx: Oropharynx is clear.  Eyes:     General: No scleral icterus.    Extraocular Movements: Extraocular movements intact.     Conjunctiva/sclera: Conjunctivae normal.     Pupils: Pupils are equal, round, and reactive to light.  Neck:     Vascular: No carotid bruit.  Cardiovascular:     Rate and Rhythm: Normal rate and regular rhythm.     Pulses: Normal pulses.     Heart sounds: Normal heart sounds. No murmur heard.    No friction rub.  Pulmonary:     Effort: Pulmonary effort is normal. No respiratory distress.     Breath sounds: Normal breath sounds. No wheezing, rhonchi or rales.  Musculoskeletal:        General: Normal range of motion.     Cervical back: Neck supple.     Right lower leg: No edema.     Left lower leg: No edema.  Skin:    General: Skin is warm and dry.     Coloration: Skin is not jaundiced or pale.      Comments: Presence of facial hair  Neurological:     General: No focal deficit present.     Mental Status: She is alert.  Deep Tendon Reflexes: Reflexes normal.  Psychiatric:        Mood and Affect: Mood normal.        Behavior: Behavior normal.         Assessment & Plan:   Encounter to establish care  Type 2 diabetes mellitus with diabetic mononeuropathy, without long-term current use of insulin (HCC) Assessment & Plan: Type 2 diabetes mellitus with possible neuropathy-related numbness in toes. No prior diabetes management education. Current medication is Mounjaro, but insurance issues may affect continuation. Mounjaro may benefit diabetes control and weight loss, potentially alleviating back pain and improving PCOS symptoms. Consideration of Trulicity if Mounjaro is not approved. - Send prescription for Mounjaro 2.5 mg - Educate on diabetes management including A1c every 3 months, yearly eye exams, foot exams, and urine tests - Consider alternative medications like Trulicity if Florence Hunt is not approved  Orders: -     Tirzepatide; Inject 2.5 mg into the skin once a week.  Dispense: 2 mL; Refill: 6  Mild persistent asthma without complication Assessment & Plan: Stable. Mild persistent asthma with seasonal exacerbations and inhaler use approximately three times a week. Lives in an environment with potential allergens. - Continue current regimen. - Send prescription for albuterol  inhaler  Orders: -     Albuterol  Sulfate HFA; Inhale 1-2 puffs into the lungs every 6 (six) hours as needed for wheezing.  Dispense: 1 each; Refill: 6  PCOS (polycystic ovarian syndrome) Assessment & Plan: PCOS with irregular periods and hirsutism. No recent gynecological follow-up. Weight loss and medications like spironolactone or drospirenone may improve symptoms, fertility, and hormonal balance. - Followup with gynecologist for further management of PCOS - Discuss potential use of spironolactone  or drospirenone with gynecologist   Radiculopathy of lumbar region Assessment & Plan: Chronic back pain "managed" with marijuana use. Prefers to avoid narcotics. Weight loss may alleviate symptoms and improve overall health. - Encourage weight loss to help manage back pain   Acquired hypothyroidism Assessment & Plan: Concern for unstable condition. Hypothyroidism with low energy and hair thinning. Recently started on levothyroxine three weeks ago. Not time to recheck TSH yet. - Recheck TSH in three weeks -Continue current dose of levothyroxine.   High cholesterol Assessment & Plan: Supposedly noted on recent labs.  Will assess via record review.   Vitamin D deficiency Assessment & Plan: Noted on recent labs-will assess with record review.   Recurrent kidney stones Assessment & Plan: Stable- no recent recurrence. Recurrent calcium-based kidney stones. Potential use of thiazide diuretics to reduce urinary calcium and prevent stone formation. Prefers to focus on other health issues first. - Consider thiazide diuretics if blood pressure allows and she is ready to address kidney stones  - Drink plenty of water.      Return in about 3 months (around 05/22/2024) for Chronic Followup, DM.   Cleo Villamizar T Arden Axon, PA-C

## 2024-02-20 NOTE — Assessment & Plan Note (Signed)
 Chronic back pain "managed" with marijuana use. Prefers to avoid narcotics. Weight loss may alleviate symptoms and improve overall health. - Encourage weight loss to help manage back pain

## 2024-02-20 NOTE — Assessment & Plan Note (Signed)
 Supposedly noted on recent labs.  Will assess via record review.

## 2024-02-20 NOTE — Assessment & Plan Note (Signed)
 Type 2 diabetes mellitus with possible neuropathy-related numbness in toes. No prior diabetes management education. Current medication is Mounjaro, but insurance issues may affect continuation. Mounjaro may benefit diabetes control and weight loss, potentially alleviating back pain and improving PCOS symptoms. Consideration of Trulicity if Mounjaro is not approved. - Send prescription for Mounjaro 2.5 mg - Educate on diabetes management including A1c every 3 months, yearly eye exams, foot exams, and urine tests - Consider alternative medications like Trulicity if Florence Hunt is not approved

## 2024-02-20 NOTE — Assessment & Plan Note (Addendum)
 Stable- no recent recurrence. Recurrent calcium-based kidney stones. Potential use of thiazide diuretics to reduce urinary calcium and prevent stone formation. Prefers to focus on other health issues first. - Consider thiazide diuretics if blood pressure allows and she is ready to address kidney stones  - Drink plenty of water.

## 2024-02-20 NOTE — Assessment & Plan Note (Addendum)
 Stable. Mild persistent asthma with seasonal exacerbations and inhaler use approximately three times a week. Lives in an environment with potential allergens. - Continue current regimen. - Send prescription for albuterol  inhaler

## 2024-02-20 NOTE — Assessment & Plan Note (Signed)
 PCOS with irregular periods and hirsutism. No recent gynecological follow-up. Weight loss and medications like spironolactone or drospirenone may improve symptoms, fertility, and hormonal balance. - Followup with gynecologist for further management of PCOS - Discuss potential use of spironolactone or drospirenone with gynecologist

## 2024-02-20 NOTE — Assessment & Plan Note (Signed)
 Noted on recent labs-will assess with record review.

## 2024-02-24 ENCOUNTER — Encounter (HOSPITAL_BASED_OUTPATIENT_CLINIC_OR_DEPARTMENT_OTHER): Payer: Self-pay | Admitting: Student

## 2024-02-24 DIAGNOSIS — R9431 Abnormal electrocardiogram [ECG] [EKG]: Secondary | ICD-10-CM | POA: Diagnosis not present

## 2024-02-24 DIAGNOSIS — R0609 Other forms of dyspnea: Secondary | ICD-10-CM | POA: Diagnosis not present

## 2024-02-24 DIAGNOSIS — Z87898 Personal history of other specified conditions: Secondary | ICD-10-CM | POA: Diagnosis not present

## 2024-02-24 DIAGNOSIS — E782 Mixed hyperlipidemia: Secondary | ICD-10-CM | POA: Diagnosis not present

## 2024-02-24 DIAGNOSIS — Z981 Arthrodesis status: Secondary | ICD-10-CM | POA: Diagnosis not present

## 2024-02-24 DIAGNOSIS — R079 Chest pain, unspecified: Secondary | ICD-10-CM | POA: Diagnosis not present

## 2024-02-24 DIAGNOSIS — E1159 Type 2 diabetes mellitus with other circulatory complications: Secondary | ICD-10-CM | POA: Diagnosis not present

## 2024-02-24 DIAGNOSIS — Z7689 Persons encountering health services in other specified circumstances: Secondary | ICD-10-CM | POA: Diagnosis not present

## 2024-02-25 ENCOUNTER — Other Ambulatory Visit (HOSPITAL_BASED_OUTPATIENT_CLINIC_OR_DEPARTMENT_OTHER): Payer: Self-pay

## 2024-02-27 ENCOUNTER — Other Ambulatory Visit (HOSPITAL_BASED_OUTPATIENT_CLINIC_OR_DEPARTMENT_OTHER): Payer: Self-pay

## 2024-02-27 ENCOUNTER — Encounter (HOSPITAL_BASED_OUTPATIENT_CLINIC_OR_DEPARTMENT_OTHER): Payer: Self-pay

## 2024-02-27 DIAGNOSIS — E1141 Type 2 diabetes mellitus with diabetic mononeuropathy: Secondary | ICD-10-CM

## 2024-02-27 MED ORDER — TRULICITY 0.75 MG/0.5ML ~~LOC~~ SOAJ: 0.7500 mg | SUBCUTANEOUS | 3 refills | Status: DC

## 2024-03-05 DIAGNOSIS — L219 Seborrheic dermatitis, unspecified: Secondary | ICD-10-CM | POA: Diagnosis not present

## 2024-03-05 DIAGNOSIS — L680 Hirsutism: Secondary | ICD-10-CM | POA: Diagnosis not present

## 2024-03-18 ENCOUNTER — Other Ambulatory Visit (HOSPITAL_BASED_OUTPATIENT_CLINIC_OR_DEPARTMENT_OTHER): Payer: Self-pay

## 2024-03-18 ENCOUNTER — Encounter (HOSPITAL_BASED_OUTPATIENT_CLINIC_OR_DEPARTMENT_OTHER): Payer: Self-pay

## 2024-04-06 DIAGNOSIS — I517 Cardiomegaly: Secondary | ICD-10-CM | POA: Diagnosis not present

## 2024-04-06 DIAGNOSIS — R079 Chest pain, unspecified: Secondary | ICD-10-CM | POA: Diagnosis not present

## 2024-04-14 DIAGNOSIS — R079 Chest pain, unspecified: Secondary | ICD-10-CM | POA: Diagnosis not present

## 2024-05-08 ENCOUNTER — Encounter (HOSPITAL_BASED_OUTPATIENT_CLINIC_OR_DEPARTMENT_OTHER): Payer: Self-pay | Admitting: Student

## 2024-05-08 ENCOUNTER — Other Ambulatory Visit (HOSPITAL_BASED_OUTPATIENT_CLINIC_OR_DEPARTMENT_OTHER): Payer: Self-pay | Admitting: Family Medicine

## 2024-05-08 DIAGNOSIS — E039 Hypothyroidism, unspecified: Secondary | ICD-10-CM

## 2024-05-08 MED ORDER — LEVOTHYROXINE SODIUM 50 MCG PO TABS
50.0000 ug | ORAL_TABLET | Freq: Every morning | ORAL | 0 refills | Status: DC
Start: 2024-05-08 — End: 2024-05-22

## 2024-05-22 ENCOUNTER — Other Ambulatory Visit (HOSPITAL_BASED_OUTPATIENT_CLINIC_OR_DEPARTMENT_OTHER): Payer: Self-pay | Admitting: Family Medicine

## 2024-05-22 ENCOUNTER — Ambulatory Visit (INDEPENDENT_AMBULATORY_CARE_PROVIDER_SITE_OTHER): Admitting: Student

## 2024-05-22 ENCOUNTER — Encounter (HOSPITAL_BASED_OUTPATIENT_CLINIC_OR_DEPARTMENT_OTHER): Payer: Self-pay | Admitting: Student

## 2024-05-22 VITALS — BP 124/87 | HR 76 | Temp 98.7°F | Resp 16 | Ht 66.5 in | Wt 241.5 lb

## 2024-05-22 DIAGNOSIS — E1141 Type 2 diabetes mellitus with diabetic mononeuropathy: Secondary | ICD-10-CM

## 2024-05-22 DIAGNOSIS — E282 Polycystic ovarian syndrome: Secondary | ICD-10-CM | POA: Diagnosis not present

## 2024-05-22 DIAGNOSIS — F909 Attention-deficit hyperactivity disorder, unspecified type: Secondary | ICD-10-CM

## 2024-05-22 DIAGNOSIS — E039 Hypothyroidism, unspecified: Secondary | ICD-10-CM

## 2024-05-22 DIAGNOSIS — E7800 Pure hypercholesterolemia, unspecified: Secondary | ICD-10-CM

## 2024-05-22 DIAGNOSIS — J4530 Mild persistent asthma, uncomplicated: Secondary | ICD-10-CM

## 2024-05-22 DIAGNOSIS — B029 Zoster without complications: Secondary | ICD-10-CM | POA: Diagnosis not present

## 2024-05-22 MED ORDER — LIDOCAINE 5 % EX PTCH
1.0000 | MEDICATED_PATCH | CUTANEOUS | 0 refills | Status: AC
Start: 2024-05-22 — End: ?

## 2024-05-22 MED ORDER — LEVOTHYROXINE SODIUM 50 MCG PO TABS
50.0000 ug | ORAL_TABLET | Freq: Every day | ORAL | 2 refills | Status: DC
Start: 2024-05-22 — End: 2024-08-10

## 2024-05-22 NOTE — Assessment & Plan Note (Signed)
 Chronic, stable.  Ongoing management challenges with current treatment. Trulicity  causing severe congestion, cold-like symptoms, constipation, and headaches. Previous medication, Mounjaro , was well-tolerated but insurance requires trial of other medications. - Order A1c test every three months - Order urine test to check for proteinuria and assess kidney function - Perform yearly foot exam - Discussed need for yearly eye exam to check for diabetic retinopathy - Attempt to switch GLP-1 medication from Trulicity  to another option due to adverse effects - Schedule monthly follow-up while titrating new GLP-1 medication - Assess with labs

## 2024-05-22 NOTE — Progress Notes (Signed)
 Established Patient Office Visit  Subjective   Patient ID: Cassie Blake, female    DOB: [birth year]  Age: 33 y.o. MRN: [id number]  Chief Complaint  Patient presents with   Medical Management of Chronic Issues    Follow up. Has had major congestion and constipation on trulicity . Wanted to see if we could try to get mounjaro  authorization.    HPI  Discussed the use of AI scribe software for clinical note transcription with the patient, who gave verbal consent to proceed.  History of Present Illness   Cassie Blake is a 33 year old female with diabetes and PCOS who presents for a follow-up visit to discuss her diabetes management.  She has been managing her diabetes by modifying her diet and reports an overall improvement in her well-being. She is currently on Trulicity  for diabetes management but experiences severe congestion and cold-like symptoms weekly after the injection. Additionally, she reports constipation and headaches with Trulicity , which were not present with her previous medication, Mounjaro . She prefers Mounjaro  but switched due to insurance requirements.  She was recently started on thyroid medication after her thyroid levels were checked at her last visit. She feels better overall since starting the medication but is unsure of its specific effects. She attributes hair loss and increased hair growth to her PCOS.  She has a history of ADHD and wants to restart Vyvanse, which was previously prescribed by Dr. Unknown at [facility]. She has been cleared by a cardiologist for heart concerns related to ADHD medication. She has a history of addiction, during which she did not maintain her medications.  She reports that her foot sometimes feels 'dead' and suspects it might be related to back nerves.      Patient Active Problem List   Diagnosis Date Noted   Attention deficit hyperactivity disorder (ADHD) 05/22/2024   PCOS (polycystic ovarian syndrome) 02/20/2024   Type 2 diabetes  mellitus with diabetic mononeuropathy, without long-term current use of insulin (HCC) 02/20/2024   Mild persistent asthma without complication 02/20/2024   Vitamin D deficiency 02/20/2024   Recurrent kidney stones 02/20/2024   High cholesterol 01/30/2024   Hypothyroidism 01/30/2024   Radiculopathy of lumbar region 05/22/2023   Past Medical History:  Diagnosis Date   Allergy    Anxiety    Asthma    Clostridium difficile infection    being treated currently   Complication of anesthesia    Constipation, chronic    Depression    GERD (gastroesophageal reflux disease)    H/O drug abuse (HCC)    Impetigo    Polycystic ovary syndrome    PONV (postoperative nausea and vomiting)    Social History   Tobacco Use   Smoking status: Former    Current packs/day: 0.00    Average packs/day: 0.3 packs/day for 18.4 years (4.6 ttl pk-yrs)    Types: Cigarettes    Start date: 2006    Quit date: 02/26/2023    Years since quitting: 1.2    Passive exposure: Past   Smokeless tobacco: Never  Vaping Use   Vaping status: Some Days  Substance Use Topics   Alcohol use: Not Currently    Alcohol/week: 2.0 standard drinks of alcohol    Types: 2 Glasses of wine per week    Comment: QUIT 2025   Drug use: Yes    Types: Marijuana    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   No Known Allergies    ROS Per HPI.  Objective:     BP 124/87   Pulse 76   Temp 98.7 F (37.1 C) (Oral)   Resp 16   Ht 5' 6.5 (1.689 m)   Wt 241 lb 8 oz (109.5 kg)   SpO2 97%   BMI 38.40 kg/m  BP Readings from Last 3 Encounters:  05/22/24 124/87  02/20/24 135/82  05/23/23 (!) 128/92   Wt Readings from Last 3 Encounters:  05/22/24 241 lb 8 oz (109.5 kg)  02/20/24 270 lb 9.6 oz (122.7 kg)  05/22/23 280 lb (127 kg)      Physical Exam Constitutional:      General: She is not in acute distress.    Appearance: Normal appearance. She is not ill-appearing.  HENT:     Head: Normocephalic and  atraumatic.     Nose: Nose normal.  Eyes:     General: No scleral icterus.    Conjunctiva/sclera: Conjunctivae normal.  Cardiovascular:     Rate and Rhythm: Normal rate and regular rhythm.     Heart sounds: Normal heart sounds. No murmur heard.    No friction rub.  Pulmonary:     Effort: Pulmonary effort is normal. No respiratory distress.     Breath sounds: Normal breath sounds. No wheezing, rhonchi or rales.  Musculoskeletal:        General: Normal range of motion.  Skin:    General: Skin is warm and dry.     Coloration: Skin is not jaundiced or pale.  Neurological:     General: No focal deficit present.     Mental Status: She is alert.  Psychiatric:        Mood and Affect: Mood normal.        Behavior: Behavior normal.      No results found for any visits on 05/22/24.  Last CBC Lab Results  Component Value Date   WBC 6.6 05/16/2023   HGB 15.7 (H) 05/16/2023   HCT 45.8 05/16/2023   MCV 89.6 05/16/2023   MCH 30.7 05/16/2023   RDW 11.9 05/16/2023   PLT 271 05/16/2023   Last metabolic panel Lab Results  Component Value Date   GLUCOSE 150 (H) 01/24/2022   NA 134 (L) 01/24/2022   K 4.1 01/24/2022   CL 102 01/24/2022   CO2 24 01/24/2022   BUN 10 01/24/2022   CREATININE 0.65 01/24/2022   GFRNONAA >60 01/24/2022   CALCIUM 9.0 01/24/2022   PROT 7.3 01/24/2022   ALBUMIN 4.0 01/24/2022   LABGLOB 2.4 03/06/2017   AGRATIO 1.9 03/06/2017   BILITOT 0.3 01/24/2022   ALKPHOS 55 01/24/2022   AST 25 01/24/2022   ALT 37 01/24/2022   ANIONGAP 8 01/24/2022   Last lipids No results found for: CHOL, HDL, LDLCALC, LDLDIRECT, TRIG, CHOLHDL Last hemoglobin A1c Lab Results  Component Value Date   HGBA1C 5.5 03/06/2017      The ASCVD Risk score (Arnett DK, et al., 2019) failed to calculate for the following reasons:   The 2019 ASCVD risk score is only valid for ages 65 to 35    Assessment & Plan:   Type 2 diabetes mellitus with diabetic mononeuropathy,  without long-term current use of insulin (HCC) Assessment & Plan: Chronic, stable.  Ongoing management challenges with current treatment. Trulicity  causing severe congestion, cold-like symptoms, constipation, and headaches. Previous medication, Mounjaro , was well-tolerated but insurance requires trial of other medications. - Order A1c test every three months - Order urine test to check for proteinuria and assess kidney function -  Perform yearly foot exam - Discussed need for yearly eye exam to check for diabetic retinopathy - Attempt to switch GLP-1 medication from Trulicity  to another option due to adverse effects - Schedule monthly follow-up while titrating new GLP-1 medication - Assess with labs  Orders: -     CBC with Differential/Platelet -     Comprehensive metabolic panel with GFR -     Hemoglobin A1c -     Microalbumin / creatinine urine ratio  Herpes zoster without complication -     Lidocaine ; Place 1 patch onto the skin daily. Remove & Discard patch within 12 hours or as directed by MD  Dispense: 30 patch; Refill: 0  Acquired hypothyroidism Assessment & Plan: Recently started on thyroid medication. Symptoms such as hair loss and changes in hair growth may be related to both hypothyroidism and PCOS. - Order thyroid function tests to assess current thyroid medication efficacy  Orders: -     Comprehensive metabolic panel with GFR -     TSH + free T4 -     Levothyroxine  Sodium; Take 1 tablet (50 mcg total) by mouth daily before breakfast.  Dispense: 30 tablet; Refill: 2  High cholesterol Assessment & Plan: Chronic. - Discussed with labs today  Orders: -     Lipid panel  Mild persistent asthma without complication Assessment & Plan: Chronic, stable. - Continue current regimen.   Attention deficit hyperactivity disorder (ADHD), unspecified ADHD type Assessment & Plan: Previous treatment with Vyvanse. Cardiologist clearance obtained for heart concerns. Previous  prescriber was Dr. Unknown at [facility]. Records needed to resume Vyvanse prescription. - Obtain previous medical records regarding Vyvanse prescription - Consider starting Vyvanse at next visit if records are obtained - Perform drug test and establish medication contract at next visit      Return if symptoms worsen or fail to improve.    Tayloranne Lekas T Danah Reinecke, PA-C

## 2024-05-22 NOTE — Patient Instructions (Signed)
 It was nice to see you today!  If you have any problems before your next visit feel free to message me via MyChart (minor issues or questions) or call the office, otherwise you may reach out to schedule an office visit.  Thank you! Pau Banh, PA-C

## 2024-05-22 NOTE — Assessment & Plan Note (Signed)
 Previous treatment with Vyvanse. Cardiologist clearance obtained for heart concerns. Previous prescriber was Dr. Unknown at [facility]. Records needed to resume Vyvanse prescription. - Obtain previous medical records regarding Vyvanse prescription - Consider starting Vyvanse at next visit if records are obtained - Perform drug test and establish medication contract at next visit

## 2024-05-22 NOTE — Assessment & Plan Note (Signed)
 Chronic, stable. Continue current regimen.

## 2024-05-22 NOTE — Assessment & Plan Note (Signed)
 Recently started on thyroid medication. Symptoms such as hair loss and changes in hair growth may be related to both hypothyroidism and PCOS. - Order thyroid function tests to assess current thyroid medication efficacy

## 2024-05-22 NOTE — Assessment & Plan Note (Signed)
 Chronic. - Discussed with labs today

## 2024-05-24 LAB — CBC WITH DIFFERENTIAL/PLATELET
Basophils Absolute: 0 x10E3/uL (ref 0.0–0.2)
Basos: 1 %
EOS (ABSOLUTE): 0.3 x10E3/uL (ref 0.0–0.4)
Eos: 4 %
Hematocrit: 47.4 % — ABNORMAL HIGH (ref 34.0–46.6)
Hemoglobin: 15.6 g/dL (ref 11.1–15.9)
Immature Grans (Abs): 0 x10E3/uL (ref 0.0–0.1)
Immature Granulocytes: 0 %
Lymphocytes Absolute: 2.2 x10E3/uL (ref 0.7–3.1)
Lymphs: 34 %
MCH: 30.4 pg (ref 26.6–33.0)
MCHC: 32.9 g/dL (ref 31.5–35.7)
MCV: 92 fL (ref 79–97)
Monocytes Absolute: 0.4 x10E3/uL (ref 0.1–0.9)
Monocytes: 6 %
Neutrophils Absolute: 3.5 x10E3/uL (ref 1.4–7.0)
Neutrophils: 55 %
Platelets: 288 x10E3/uL (ref 150–450)
RBC: 5.14 x10E6/uL (ref 3.77–5.28)
RDW: 11.9 % (ref 11.7–15.4)
WBC: 6.4 x10E3/uL (ref 3.4–10.8)

## 2024-05-24 LAB — COMPREHENSIVE METABOLIC PANEL WITH GFR
ALT: 26 IU/L (ref 0–32)
AST: 21 IU/L (ref 0–40)
Albumin: 4.6 g/dL (ref 3.9–4.9)
Alkaline Phosphatase: 58 IU/L (ref 44–121)
BUN/Creatinine Ratio: 16 (ref 9–23)
BUN: 12 mg/dL (ref 6–20)
Bilirubin Total: 0.3 mg/dL (ref 0.0–1.2)
CO2: 22 mmol/L (ref 20–29)
Calcium: 10.1 mg/dL (ref 8.7–10.2)
Chloride: 102 mmol/L (ref 96–106)
Creatinine, Ser: 0.74 mg/dL (ref 0.57–1.00)
Globulin, Total: 2.8 g/dL (ref 1.5–4.5)
Glucose: 93 mg/dL (ref 70–99)
Potassium: 4.4 mmol/L (ref 3.5–5.2)
Sodium: 141 mmol/L (ref 134–144)
Total Protein: 7.4 g/dL (ref 6.0–8.5)
eGFR: 110 mL/min/1.73 (ref >59)

## 2024-05-24 LAB — MICROALBUMIN / CREATININE URINE RATIO
Creatinine, Urine: 91.3 mg/dL
Microalb/Creat Ratio: 76 mg/g{creat} — ABNORMAL HIGH (ref 0–29)
Microalbumin, Urine: 69.7 ug/mL

## 2024-05-24 LAB — TSH+FREE T4
Free T4: 1.28 ng/dL (ref 0.82–1.77)
TSH: 4.9 u[IU]/mL — ABNORMAL HIGH (ref 0.450–4.500)

## 2024-05-24 LAB — LIPID PANEL
Chol/HDL Ratio: 7.5 ratio — ABNORMAL HIGH (ref 0.0–4.4)
Cholesterol, Total: 255 mg/dL — ABNORMAL HIGH (ref 100–199)
HDL: 34 mg/dL — ABNORMAL LOW (ref >39)
LDL Chol Calc (NIH): 175 mg/dL — ABNORMAL HIGH (ref 0–99)
Triglycerides: 241 mg/dL — ABNORMAL HIGH (ref 0–149)
VLDL Cholesterol Cal: 46 mg/dL — ABNORMAL HIGH (ref 5–40)

## 2024-05-24 LAB — HEMOGLOBIN A1C
Est. average glucose Bld gHb Est-mCnc: 108 mg/dL
Hgb A1c MFr Bld: 5.4 % (ref 4.8–5.6)

## 2024-05-27 ENCOUNTER — Ambulatory Visit (HOSPITAL_BASED_OUTPATIENT_CLINIC_OR_DEPARTMENT_OTHER): Payer: Self-pay | Admitting: Student

## 2024-05-27 ENCOUNTER — Encounter (HOSPITAL_BASED_OUTPATIENT_CLINIC_OR_DEPARTMENT_OTHER): Payer: Self-pay

## 2024-06-02 ENCOUNTER — Encounter (HOSPITAL_BASED_OUTPATIENT_CLINIC_OR_DEPARTMENT_OTHER): Payer: Self-pay

## 2024-06-17 ENCOUNTER — Encounter (HOSPITAL_BASED_OUTPATIENT_CLINIC_OR_DEPARTMENT_OTHER): Payer: Self-pay

## 2024-06-17 ENCOUNTER — Other Ambulatory Visit (HOSPITAL_BASED_OUTPATIENT_CLINIC_OR_DEPARTMENT_OTHER): Payer: Self-pay | Admitting: Student

## 2024-06-17 DIAGNOSIS — E1141 Type 2 diabetes mellitus with diabetic mononeuropathy: Secondary | ICD-10-CM

## 2024-06-17 MED ORDER — TRULICITY 1.5 MG/0.5ML ~~LOC~~ SOAJ: 1.5000 mg | SUBCUTANEOUS | 1 refills | Status: DC

## 2024-08-09 ENCOUNTER — Other Ambulatory Visit (HOSPITAL_BASED_OUTPATIENT_CLINIC_OR_DEPARTMENT_OTHER): Payer: Self-pay | Admitting: Student

## 2024-08-09 DIAGNOSIS — E039 Hypothyroidism, unspecified: Secondary | ICD-10-CM

## 2024-08-24 NOTE — Progress Notes (Signed)
   08/24/2024  Patient ID: Cassie Blake, female   DOB: 01-17-1991, 33 y.o.   MRN: [id number]  Pharmacy Quality Measure Review  This patient is appearing on a report for being at risk of failing the Glycemic Status Assessment in Diabetes measure this calendar year.   a1c at goal and uacr/egfr completed in august 2025. A1c gap closed  KED closed with documented eGFR and uACR 2025.   Lang Sieve, PharmD, BCGP Clinical Pharmacist  [phone]

## 2024-09-03 ENCOUNTER — Ambulatory Visit (HOSPITAL_BASED_OUTPATIENT_CLINIC_OR_DEPARTMENT_OTHER): Admitting: Student

## 2024-09-04 ENCOUNTER — Other Ambulatory Visit (HOSPITAL_BASED_OUTPATIENT_CLINIC_OR_DEPARTMENT_OTHER): Payer: Self-pay | Admitting: Student

## 2024-09-04 DIAGNOSIS — E1141 Type 2 diabetes mellitus with diabetic mononeuropathy: Secondary | ICD-10-CM

## 2024-09-06 ENCOUNTER — Other Ambulatory Visit (HOSPITAL_BASED_OUTPATIENT_CLINIC_OR_DEPARTMENT_OTHER): Payer: Self-pay | Admitting: Student

## 2024-09-06 DIAGNOSIS — E1141 Type 2 diabetes mellitus with diabetic mononeuropathy: Secondary | ICD-10-CM

## 2024-09-08 ENCOUNTER — Other Ambulatory Visit (HOSPITAL_BASED_OUTPATIENT_CLINIC_OR_DEPARTMENT_OTHER): Payer: Self-pay | Admitting: Student

## 2024-09-08 DIAGNOSIS — E1141 Type 2 diabetes mellitus with diabetic mononeuropathy: Secondary | ICD-10-CM

## 2024-09-08 MED ORDER — TRULICITY 3 MG/0.5ML ~~LOC~~ SOAJ: 3.0000 mg | SUBCUTANEOUS | 5 refills | Status: DC

## 2024-11-02 ENCOUNTER — Other Ambulatory Visit (HOSPITAL_BASED_OUTPATIENT_CLINIC_OR_DEPARTMENT_OTHER): Payer: Self-pay

## 2024-11-02 DIAGNOSIS — E1141 Type 2 diabetes mellitus with diabetic mononeuropathy: Secondary | ICD-10-CM

## 2024-11-02 DIAGNOSIS — E039 Hypothyroidism, unspecified: Secondary | ICD-10-CM

## 2024-11-02 MED ORDER — TRULICITY 3 MG/0.5ML ~~LOC~~ SOAJ: 3.0000 mg | SUBCUTANEOUS | 0 refills | Status: AC

## 2024-11-02 MED ORDER — LEVOTHYROXINE SODIUM 50 MCG PO TABS: 50.0000 ug | ORAL_TABLET | Freq: Every day | ORAL | 0 refills | Status: DC

## 2024-11-03 ENCOUNTER — Other Ambulatory Visit (HOSPITAL_BASED_OUTPATIENT_CLINIC_OR_DEPARTMENT_OTHER): Payer: Self-pay | Admitting: Student

## 2024-11-03 DIAGNOSIS — E039 Hypothyroidism, unspecified: Secondary | ICD-10-CM

## 2024-11-11 ENCOUNTER — Other Ambulatory Visit (HOSPITAL_COMMUNITY): Payer: Self-pay

## 2024-11-11 ENCOUNTER — Telehealth (HOSPITAL_BASED_OUTPATIENT_CLINIC_OR_DEPARTMENT_OTHER): Payer: Self-pay

## 2024-11-11 NOTE — Telephone Encounter (Signed)
 Pharmacy Patient Advocate Encounter   Received notification from Physician's Office that prior authorization for Trulicity  3MG /0.5ML auto-injectors is required/requested.   Insurance verification completed.   The patient is insured through [place] [employer].   Per test claim: PA required; PA submitted to above mentioned insurance via Latent Key/confirmation #/EOC [id number] Status is pending

## 2024-11-11 NOTE — Telephone Encounter (Signed)
 Pharmacy Patient Advocate Encounter  Received notification from  [place] [employer] that Prior Authorization for  Trulicity  3MG /0.5ML auto-injectors  has been APPROVED from 11/11/2024 to 11/11/2025. Ran test claim, Copay is $25. This test claim was processed through [facility]- copay amounts may vary at other pharmacies due to pharmacy/plan contracts, or as the patient moves through the different stages of their insurance plan.   PA #/Case ID/Reference #: [id number]

## 2024-11-12 ENCOUNTER — Telehealth (HOSPITAL_BASED_OUTPATIENT_CLINIC_OR_DEPARTMENT_OTHER): Payer: Self-pay | Admitting: *Deleted

## 2024-11-12 NOTE — Telephone Encounter (Signed)
 Pt. Made aware.

## 2025-02-03 ENCOUNTER — Ambulatory Visit (HOSPITAL_BASED_OUTPATIENT_CLINIC_OR_DEPARTMENT_OTHER): Admitting: Student
